# Patient Record
Sex: Female | Born: 1980 | Race: Asian | Hispanic: No | Marital: Married | Smoking: Never smoker
Health system: Southern US, Community
[De-identification: ages and names within clinical notes are randomized; demographics above are authoritative.]

## PROBLEM LIST (undated history)

## (undated) DIAGNOSIS — J45909 Unspecified asthma, uncomplicated: Secondary | ICD-10-CM

## (undated) DIAGNOSIS — N83209 Unspecified ovarian cyst, unspecified side: Secondary | ICD-10-CM

## (undated) DIAGNOSIS — D569 Thalassemia, unspecified: Secondary | ICD-10-CM

## (undated) DIAGNOSIS — N760 Acute vaginitis: Secondary | ICD-10-CM

## (undated) DIAGNOSIS — L309 Dermatitis, unspecified: Secondary | ICD-10-CM

## (undated) DIAGNOSIS — B9689 Other specified bacterial agents as the cause of diseases classified elsewhere: Secondary | ICD-10-CM

## (undated) HISTORY — DX: Unspecified asthma, uncomplicated: J45.909

## (undated) HISTORY — DX: Unspecified ovarian cyst, unspecified side: N83.209

## (undated) HISTORY — DX: Other specified bacterial agents as the cause of diseases classified elsewhere: N76.0

## (undated) HISTORY — DX: Dermatitis, unspecified: L30.9

## (undated) HISTORY — DX: Other specified bacterial agents as the cause of diseases classified elsewhere: B96.89

---

## 2016-05-08 ENCOUNTER — Encounter: Payer: Self-pay | Admitting: Family

## 2016-05-08 ENCOUNTER — Ambulatory Visit (INDEPENDENT_AMBULATORY_CARE_PROVIDER_SITE_OTHER): Payer: Self-pay | Admitting: Family

## 2016-05-08 VITALS — BP 104/68 | HR 78 | Temp 97.4°F | Wt 127.4 lb

## 2016-05-08 DIAGNOSIS — L298 Other pruritus: Secondary | ICD-10-CM

## 2016-05-08 DIAGNOSIS — N898 Other specified noninflammatory disorders of vagina: Secondary | ICD-10-CM

## 2016-05-08 DIAGNOSIS — Z0000 Encounter for general adult medical examination without abnormal findings: Secondary | ICD-10-CM | POA: Insufficient documentation

## 2016-05-08 MED ORDER — FLUCONAZOLE 150 MG PO TABS: 150.0000 mg | ORAL_TABLET | Freq: Once | ORAL | 1 refills | Status: AC

## 2016-05-08 NOTE — Assessment & Plan Note (Addendum)
Symptoms c/w vulvovaginal candidiasis. Trial of diflucan for yeast infection. Patient and I jointly decided not to do wet prep to more formally diagnose candidiasis due to cost at this time. We will treat empirically with close follow-up.

## 2016-05-08 NOTE — Progress Notes (Addendum)
Subjective:    Patient ID: Marissa Walsh, female    DOB: [birth year], 35 y.o.   MRN: [id number]  CC: Marissa KavaYimei Cade is a 35 y.o. female who presents today to establish care and for CPE.  HPI: Patient is a new patient here to establish care. Requesting medical records. Speaks mandarin. Interpretor in room.   She complains of external vaginal itching. Redness. Requesting a cream.   States pap smear ~4 years ago, normal.   No family h/o breast cancer.      HISTORY:  Past Medical History:  Diagnosis Date  . Asthma    History reviewed. No pertinent surgical history. Family History  Problem Relation Age of Onset  . Hyperlipidemia Mother   . Hypertension Mother   . Diabetes Mother   . Cancer Father     Allergies: Review of patient's allergies indicates not on file. No current outpatient prescriptions on file prior to visit.   No current facility-administered medications on file prior to visit.     Social History  Substance Use Topics  . Smoking status: Never Smoker  . Smokeless tobacco: Never Used  . Alcohol use No    Review of Systems  Constitutional: Negative for chills, fever and unexpected weight change.  HENT: Negative for congestion.   Respiratory: Negative for cough.   Cardiovascular: Negative for chest pain, palpitations and leg swelling.  Gastrointestinal: Negative for nausea and vomiting.  Genitourinary: Negative for dysuria, frequency, hematuria, vaginal discharge and vaginal pain.  Musculoskeletal: Negative for arthralgias and myalgias.  Skin: Negative for rash.  Neurological: Negative for headaches.  Hematological: Negative for adenopathy.  Psychiatric/Behavioral: Negative for confusion.      Objective:    BP 104/68 (BP Location: Left Arm, Patient Position: Sitting, Cuff Size: Normal)   Pulse 78   Temp 97.4 F (36.3 C) (Oral)   Wt 127 lb 6.4 oz (57.8 kg)   LMP  (LMP Unknown)   SpO2 98%  BP Readings from Last 3 Encounters:  05/08/16 104/68   Wt  Readings from Last 3 Encounters:  05/08/16 127 lb 6.4 oz (57.8 kg)    Physical Exam  Constitutional: She appears well-developed and well-nourished.  Eyes: Conjunctivae are normal.  Cardiovascular: Normal rate, regular rhythm, normal heart sounds and normal pulses.   Pulmonary/Chest: Effort normal and breath sounds normal. She has no wheezes. She has no rhonchi. She has no rales. Right breast exhibits no inverted nipple, no mass, no nipple discharge, no skin change and no tenderness. Left breast exhibits no inverted nipple, no mass, no nipple discharge, no skin change and no tenderness. Breasts are symmetrical.  Clinical breast exam performed, no masses, asymmetry, dippling noted in supine and bent forward position.  Abdominal: There is no CVA tenderness.  Genitourinary: There is no rash, tenderness or lesion on the right labia. There is no rash, tenderness or lesion on the left labia. No erythema, tenderness or bleeding in the vagina. No foreign body in the vagina. No vaginal discharge found.  Genitourinary Comments: Diffuse external vaginal erythema. No lesions. Thick white discharge seen on external labia. No purulent discharge.   Neurological: She is alert.  Skin: Skin is warm and dry.  Psychiatric: She has a normal mood and affect. Her speech is normal and behavior is normal. Thought content normal.  Vitals reviewed.      Assessment & Plan:   Problem List Items Addressed This Visit      Musculoskeletal and Integument   Vaginal itching - Primary  Symptoms c/w vulvovaginal candidiasis. Trial of diflucan for yeast infection. Patient and I jointly decided not to do wet prep to more formally diagnose candidiasis due to cost at this time. We will treat empirically with close follow-up.       Relevant Medications   fluconazole (DIFLUCAN) 150 MG tablet     Other   Routine general medical examination at a health care facility    Due for Pap smear and screening labs. Decided to do pap  smear and screening labs once cost is assessed. Immunization records requested.       Other Visit Diagnoses   None.      I am having Ms. Bodie start on fluconazole.   Meds ordered this encounter  Medications  . fluconazole (DIFLUCAN) 150 MG tablet    Sig: Take 1 tablet (150 mg total) by mouth once. Take one tablet PO once. If continue to have symptoms, may take one tablet PO 3 days later.    Dispense:  2 tablet    Refill:  1    Order Specific Question:   Supervising Provider    Answer:   Sherlene Shams [[id number]]    Return precautions given.   Risks, benefits, and alternatives of the medications and treatment plan prescribed today were discussed, and patient expressed understanding.   Education regarding symptom management and diagnosis given to patient on AVS.  Continue to follow with Rennie Plowman, FNP for routine health maintenance.   Marissa Kava and I agreed with plan.   Rennie Plowman, FNP

## 2016-05-08 NOTE — Patient Instructions (Addendum)
Pleasure meeting you.  As we discussed, but schedule follow-up appointment in 2 months to do Pap smear and labs.  Health Maintenance, Female Adopting a healthy lifestyle and getting preventive care can go a long way to promote health and wellness. Talk with your health care provider about what schedule of regular examinations is right for you. This is a good chance for you to check in with your provider about disease prevention and staying healthy. In between checkups, there are plenty of things you can do on your own. Experts have done a lot of research about which lifestyle changes and preventive measures are most likely to keep you healthy. Ask your health care provider for more information. WEIGHT AND DIET  Eat a healthy diet  Be sure to include plenty of vegetables, fruits, low-fat dairy products, and lean protein.  Do not eat a lot of foods high in solid fats, added sugars, or salt.  Get regular exercise. This is one of the most important things you can do for your health.  Most adults should exercise for at least 150 minutes each week. The exercise should increase your heart rate and make you sweat (moderate-intensity exercise).  Most adults should also do strengthening exercises at least twice a week. This is in addition to the moderate-intensity exercise.  Maintain a healthy weight  Body mass index (BMI) is a measurement that can be used to identify possible weight problems. It estimates body fat based on height and weight. Your health care provider can help determine your BMI and help you achieve or maintain a healthy weight.  For females 50 years of age and older:   A BMI below 18.5 is considered underweight.  A BMI of 18.5 to 24.9 is normal.  A BMI of 25 to 29.9 is considered overweight.  A BMI of 30 and above is considered obese.  Watch levels of cholesterol and blood lipids  You should start having your blood tested for lipids and cholesterol at 36 years of age,  then have this test every 5 years.  You may need to have your cholesterol levels checked more often if:  Your lipid or cholesterol levels are high.  You are older than 36 years of age.  You are at high risk for heart disease.  CANCER SCREENING   Lung Cancer  Lung cancer screening is recommended for adults 10-76 years old who are at high risk for lung cancer because of a history of smoking.  A yearly low-dose CT scan of the lungs is recommended for people who:  Currently smoke.  Have quit within the past 15 years.  Have at least a 30-pack-year history of smoking. A pack year is smoking an average of one pack of cigarettes a day for 1 year.  Yearly screening should continue until it has been 15 years since you quit.  Yearly screening should stop if you develop a health problem that would prevent you from having lung cancer treatment.  Breast Cancer  Practice breast self-awareness. This means understanding how your breasts normally appear and feel.  It also means doing regular breast self-exams. Let your health care provider know about any changes, no matter how small.  If you are in your 20s or 30s, you should have a clinical breast exam (CBE) by a health care provider every 1-3 years as part of a regular health exam.  If you are 64 or older, have a CBE every year. Also consider having a breast X-ray (mammogram) every year.  If you have a family history of breast cancer, talk to your health care provider about genetic screening.  If you are at high risk for breast cancer, talk to your health care provider about having an MRI and a mammogram every year.  Breast cancer gene (BRCA) assessment is recommended for women who have family members with BRCA-related cancers. BRCA-related cancers include:  Breast.  Ovarian.  Tubal.  Peritoneal cancers.  Results of the assessment will determine the need for genetic counseling and BRCA1 and BRCA2 testing. Cervical Cancer Your  health care provider may recommend that you be screened regularly for cancer of the pelvic organs (ovaries, uterus, and vagina). This screening involves a pelvic examination, including checking for microscopic changes to the surface of your cervix (Pap test). You may be encouraged to have this screening done every 3 years, beginning at age 86.  For women ages 31-65, health care providers may recommend pelvic exams and Pap testing every 3 years, or they may recommend the Pap and pelvic exam, combined with testing for human papilloma virus (HPV), every 5 years. Some types of HPV increase your risk of cervical cancer. Testing for HPV may also be done on women of any age with unclear Pap test results.  Other health care providers may not recommend any screening for nonpregnant women who are considered low risk for pelvic cancer and who do not have symptoms. Ask your health care provider if a screening pelvic exam is right for you.  If you have had past treatment for cervical cancer or a condition that could lead to cancer, you need Pap tests and screening for cancer for at least 20 years after your treatment. If Pap tests have been discontinued, your risk factors (such as having a new sexual partner) need to be reassessed to determine if screening should resume. Some women have medical problems that increase the chance of getting cervical cancer. In these cases, your health care provider may recommend more frequent screening and Pap tests. Colorectal Cancer  This type of cancer can be detected and often prevented.  Routine colorectal cancer screening usually begins at 36 years of age and continues through 36 years of age.  Your health care provider may recommend screening at an earlier age if you have risk factors for colon cancer.  Your health care provider may also recommend using home test kits to check for hidden blood in the stool.  A small camera at the end of a tube can be used to examine your  colon directly (sigmoidoscopy or colonoscopy). This is done to check for the earliest forms of colorectal cancer.  Routine screening usually begins at age 43.  Direct examination of the colon should be repeated every 5-10 years through 36 years of age. However, you may need to be screened more often if early forms of precancerous polyps or small growths are found. Skin Cancer  Check your skin from head to toe regularly.  Tell your health care provider about any new moles or changes in moles, especially if there is a change in a mole's shape or color.  Also tell your health care provider if you have a mole that is larger than the size of a pencil eraser.  Always use sunscreen. Apply sunscreen liberally and repeatedly throughout the day.  Protect yourself by wearing long sleeves, pants, a wide-brimmed hat, and sunglasses whenever you are outside. HEART DISEASE, DIABETES, AND HIGH BLOOD PRESSURE   High blood pressure causes heart disease and increases the risk  of stroke. High blood pressure is more likely to develop in:  People who have blood pressure in the high end of the normal range (130-139/85-89 mm Hg).  People who are overweight or obese.  People who are African American.  If you are 60-52 years of age, have your blood pressure checked every 3-5 years. If you are 64 years of age or older, have your blood pressure checked every year. You should have your blood pressure measured twice--once when you are at a hospital or clinic, and once when you are not at a hospital or clinic. Record the average of the two measurements. To check your blood pressure when you are not at a hospital or clinic, you can use:  An automated blood pressure machine at a pharmacy.  A home blood pressure monitor.  If you are between 69 years and 29 years old, ask your health care provider if you should take aspirin to prevent strokes.  Have regular diabetes screenings. This involves taking a blood sample to  check your fasting blood sugar level.  If you are at a normal weight and have a low risk for diabetes, have this test once every three years after 36 years of age.  If you are overweight and have a high risk for diabetes, consider being tested at a younger age or more often. PREVENTING INFECTION  Hepatitis B  If you have a higher risk for hepatitis B, you should be screened for this virus. You are considered at high risk for hepatitis B if:  You were born in a country where hepatitis B is common. Ask your health care provider which countries are considered high risk.  Your parents were born in a high-risk country, and you have not been immunized against hepatitis B (hepatitis B vaccine).  You have HIV or AIDS.  You use needles to inject street drugs.  You live with someone who has hepatitis B.  You have had sex with someone who has hepatitis B.  You get hemodialysis treatment.  You take certain medicines for conditions, including cancer, organ transplantation, and autoimmune conditions. Hepatitis C  Blood testing is recommended for:  Everyone born from [birth year] through 1966.  Anyone with known risk factors for hepatitis C. Sexually transmitted infections (STIs)  You should be screened for sexually transmitted infections (STIs) including gonorrhea and chlamydia if:  You are sexually active and are younger than 36 years of age.  You are older than 36 years of age and your health care provider tells you that you are at risk for this type of infection.  Your sexual activity has changed since you were last screened and you are at an increased risk for chlamydia or gonorrhea. Ask your health care provider if you are at risk.  If you do not have HIV, but are at risk, it may be recommended that you take a prescription medicine daily to prevent HIV infection. This is called pre-exposure prophylaxis (PrEP). You are considered at risk if:  You are sexually active and do not regularly use  condoms or know the HIV status of your partner(s).  You take drugs by injection.  You are sexually active with a partner who has HIV. Talk with your health care provider about whether you are at high risk of being infected with HIV. If you choose to begin PrEP, you should first be tested for HIV. You should then be tested every 3 months for as long as you are taking PrEP.  PREGNANCY  If you are premenopausal and you may become pregnant, ask your health care provider about preconception counseling.  If you may become pregnant, take 400 to 800 micrograms (mcg) of folic acid every day.  If you want to prevent pregnancy, talk to your health care provider about birth control (contraception). OSTEOPOROSIS AND MENOPAUSE   Osteoporosis is a disease in which the bones lose minerals and strength with aging. This can result in serious bone fractures. Your risk for osteoporosis can be identified using a bone density scan.  If you are 14 years of age or older, or if you are at risk for osteoporosis and fractures, ask your health care provider if you should be screened.  Ask your health care provider whether you should take a calcium or vitamin D supplement to lower your risk for osteoporosis.  Menopause may have certain physical symptoms and risks.  Hormone replacement therapy may reduce some of these symptoms and risks. Talk to your health care provider about whether hormone replacement therapy is right for you.  HOME CARE INSTRUCTIONS   Schedule regular health, dental, and eye exams.  Stay current with your immunizations.   Do not use any tobacco products including cigarettes, chewing tobacco, or electronic cigarettes.  If you are pregnant, do not drink alcohol.  If you are breastfeeding, limit how much and how often you drink alcohol.  Limit alcohol intake to no more than 1 drink per day for nonpregnant women. One drink equals 12 ounces of beer, 5 ounces of wine, or 1 ounces of hard  liquor.  Do not use street drugs.  Do not share needles.  Ask your health care provider for help if you need support or information about quitting drugs.  Tell your health care provider if you often feel depressed.  Tell your health care provider if you have ever been abused or do not feel safe at home.   This information is not intended to replace advice given to you by your health care provider. Make sure you discuss any questions you have with your health care provider.   Document Released: 04/02/2011 Document Revised: 10/08/2014 Document Reviewed: 08/19/2013 Elsevier Interactive Patient Education 2016 [employer]   Monilial Vaginitis Vaginitis in a soreness, swelling and redness (inflammation) of the vagina and vulva. Monilial vaginitis is not a sexually transmitted infection. CAUSES  Yeast vaginitis is caused by yeast (candida) that is normally found in your vagina. With a yeast infection, the candida has overgrown in number to a point that upsets the chemical balance. SYMPTOMS   White, thick vaginal discharge.  Swelling, itching, redness and irritation of the vagina and possibly the lips of the vagina (vulva).  Burning or painful urination.  Painful intercourse. DIAGNOSIS  Things that may contribute to monilial vaginitis are:  Postmenopausal and virginal states.  Pregnancy.  Infections.  Being tired, sick or stressed, especially if you had monilial vaginitis in the past.  Diabetes. Good control will help lower the chance.  Birth control pills.  Tight fitting garments.  Using bubble bath, feminine sprays, douches or deodorant tampons.  Taking certain medications that kill germs (antibiotics).  Sporadic recurrence can occur if you become ill. TREATMENT  Your caregiver will give you medication.  There are several kinds of anti monilial vaginal creams and suppositories specific for monilial vaginitis. For recurrent yeast infections, use a suppository or  cream in the vagina 2 times a week, or as directed.  Anti-monilial or steroid cream for the itching or irritation of the vulva  may also be used. Get your caregiver's permission.  Painting the vagina with methylene blue solution may help if the monilial cream does not work.  Eating yogurt may help prevent monilial vaginitis. HOME CARE INSTRUCTIONS   Finish all medication as prescribed.  Do not have sex until treatment is completed or after your caregiver tells you it is okay.  Take warm sitz baths.  Do not douche.  Do not use tampons, especially scented ones.  Wear cotton underwear.  Avoid tight pants and panty hose.  Tell your sexual partner that you have a yeast infection. They should go to their caregiver if they have symptoms such as mild rash or itching.  Your sexual partner should be treated as well if your infection is difficult to eliminate.  Practice safer sex. Use condoms.  Some vaginal medications cause latex condoms to fail. Vaginal medications that harm condoms are:  Cleocin cream.  Butoconazole (Femstat).  Terconazole (Terazol) vaginal suppository.  Miconazole (Monistat) (may be purchased over the counter). SEEK MEDICAL CARE IF:   You have a temperature by mouth above 102 F (38.9 C).  The infection is getting worse after 2 days of treatment.  The infection is not getting better after 3 days of treatment.  You develop blisters in or around your vagina.  You develop vaginal bleeding, and it is not your menstrual period.  You have pain when you urinate.  You develop intestinal problems.  You have pain with sexual intercourse.   This information is not intended to replace advice given to you by your health care provider. Make sure you discuss any questions you have with your health care provider.   Document Released: 06/27/2005 Document Revised: 12/10/2011 Document Reviewed: 03/21/2015 Elsevier Interactive Patient Education [employer].

## 2016-05-08 NOTE — Assessment & Plan Note (Addendum)
Due for Pap smear and screening labs. Patient politely declined. Decided to do pap smear and screening labs once cost is assessed. Immunization records requested.

## 2016-05-11 NOTE — Progress Notes (Signed)
Spoke to patient and she stated ok and has already made appointment in October.

## 2016-05-11 NOTE — Progress Notes (Signed)
Will you try calling ( or mailing to this patient) and let her know -   Mrs. Marissa Walsh,   The self pay the cost for routine labs and pelvic exam with pap smear is approximately $180.   Would you be willing to come back in to make this appointment? If so, please give us a call to make an appointment.   Pleasure meeting you and look forward to seeing you soon!   Rory PercyBest,  [facility], NP

## 2016-07-11 ENCOUNTER — Encounter: Payer: Self-pay | Admitting: Family

## 2016-07-11 ENCOUNTER — Other Ambulatory Visit (HOSPITAL_COMMUNITY)
Admission: RE | Admit: 2016-07-11 | Discharge: 2016-07-11 | Disposition: A | Discharge status: Home / Self Care | Payer: Self-pay | Source: Ambulatory Visit | Attending: Family | Admitting: Family

## 2016-07-11 ENCOUNTER — Ambulatory Visit (INDEPENDENT_AMBULATORY_CARE_PROVIDER_SITE_OTHER): Payer: Self-pay | Admitting: Family

## 2016-07-11 VITALS — BP 102/76 | HR 64 | Temp 97.4°F | Resp 10 | Ht 62.25 in | Wt 127.0 lb

## 2016-07-11 DIAGNOSIS — Z0000 Encounter for general adult medical examination without abnormal findings: Secondary | ICD-10-CM

## 2016-07-11 DIAGNOSIS — Z01419 Encounter for gynecological examination (general) (routine) without abnormal findings: Secondary | ICD-10-CM | POA: Insufficient documentation

## 2016-07-11 DIAGNOSIS — N760 Acute vaginitis: Secondary | ICD-10-CM | POA: Insufficient documentation

## 2016-07-11 DIAGNOSIS — Z1151 Encounter for screening for human papillomavirus (HPV): Secondary | ICD-10-CM | POA: Insufficient documentation

## 2016-07-11 DIAGNOSIS — Z113 Encounter for screening for infections with a predominantly sexual mode of transmission: Secondary | ICD-10-CM | POA: Insufficient documentation

## 2016-07-11 LAB — COMPREHENSIVE METABOLIC PANEL
ALT: 14 U/L (ref 0–35)
AST: 15 U/L (ref 0–37)
Albumin: 3.9 g/dL (ref 3.5–5.2)
Alkaline Phosphatase: 42 U/L (ref 39–117)
BILIRUBIN TOTAL: 0.8 mg/dL (ref 0.2–1.2)
BUN: 13 mg/dL (ref 6–23)
CALCIUM: 9.6 mg/dL (ref 8.4–10.5)
CO2: 29 meq/L (ref 19–32)
CREATININE: 0.48 mg/dL (ref 0.40–1.20)
Chloride: 103 mEq/L (ref 96–112)
GFR: 155.35 mL/min (ref >60.00)
GLUCOSE: 82 mg/dL (ref 70–99)
Potassium: 4.1 mEq/L (ref 3.5–5.1)
Sodium: 141 mEq/L (ref 135–145)
TOTAL PROTEIN: 7.1 g/dL (ref 6.0–8.3)

## 2016-07-11 LAB — LIPID PANEL
CHOL/HDL RATIO: 4
Cholesterol: 188 mg/dL (ref 0–200)
HDL: 43.7 mg/dL (ref >39.00)
LDL CALC: 125 mg/dL — AB (ref 0–99)
NONHDL: 143.84
Triglycerides: 92 mg/dL (ref 0.0–149.0)
VLDL: 18.4 mg/dL (ref 0.0–40.0)

## 2016-07-11 LAB — CBC WITH DIFFERENTIAL/PLATELET
BASOS ABS: 0 10*3/uL (ref 0.0–0.1)
Basophils Relative: 0.5 % (ref 0.0–3.0)
EOS ABS: 0.2 10*3/uL (ref 0.0–0.7)
Eosinophils Relative: 3 % (ref 0.0–5.0)
HEMATOCRIT: 35.2 % — AB (ref 36.0–46.0)
Hemoglobin: 11.2 g/dL — ABNORMAL LOW (ref 12.0–15.0)
LYMPHS PCT: 20.5 % (ref 12.0–46.0)
Lymphs Abs: 1.3 10*3/uL (ref 0.7–4.0)
MCHC: 31.9 g/dL (ref 30.0–36.0)
MCV: 68.1 fl — ABNORMAL LOW (ref 78.0–100.0)
Monocytes Absolute: 0.4 10*3/uL (ref 0.1–1.0)
Monocytes Relative: 6 % (ref 3.0–12.0)
NEUTROS ABS: 4.4 10*3/uL (ref 1.4–7.7)
NEUTROS PCT: 70 % (ref 43.0–77.0)
PLATELETS: 202 10*3/uL (ref 150.0–400.0)
RBC: 5.17 Mil/uL — AB (ref 3.87–5.11)
RDW: 15.1 % (ref 11.5–15.5)
WBC: 6.3 10*3/uL (ref 4.0–10.5)

## 2016-07-11 LAB — VITAMIN D 25 HYDROXY (VIT D DEFICIENCY, FRACTURES): VITD: 24.21 ng/mL — AB (ref 30.00–100.00)

## 2016-07-11 LAB — TSH: TSH: 1.73 u[IU]/mL (ref 0.35–4.50)

## 2016-07-11 LAB — HEMOGLOBIN A1C: Hgb A1c MFr Bld: 5.1 % (ref 4.6–6.5)

## 2016-07-11 MED ORDER — TETANUS-DIPHTH-ACELL PERTUSSIS 5-2.5-18.5 LF-MCG/0.5 IM SUSP: 0.5000 mL | Freq: Once | INTRAMUSCULAR | 0 refills | Status: AC

## 2016-07-11 NOTE — Progress Notes (Signed)
Pre-visit discussion using our clinic review tool. No additional management support is needed unless otherwise documented below in the visit note.  

## 2016-07-11 NOTE — Progress Notes (Signed)
Subjective:    Patient ID: Marissa Walsh, female    DOB: 05-05-1981, 35 y.o.   MRN: [id number]  CC: Marissa Walsh is a 35 y.o. female who presents today for physical exam.    HPI: Patient here for CPE and pap smear.   No concern for pregnancy. Would like STD testing however no new partners.  Continues to have vaginal itching which did not improve with Diflucan. No foul odor or increase in discharge. No vaginal pain.     Father died young from colon cancer; patient would need colonoscopy in next couple of years.  Breast Cancer Screening: No early family history; no breast complaints.  Cervical Cancer Screening: Will do today.  Immunizations- no history; due for Tdap requested from patient.         HIV Screening- Candidate for ; will get today. Labs: Screening labs today. Exercise: None Alcohol use: None Smoking/tobacco use: Nonsmoker.    HISTORY:  Past Medical History:  Diagnosis Date  . Asthma     History reviewed. No pertinent surgical history. Family History  Problem Relation Age of Onset  . Hyperlipidemia Mother   . Hypertension Mother   . Diabetes Mother   . Cancer Father 70    colon cancer      ALLERGIES: Alcohol-sulfur [sulfur]  No current outpatient prescriptions on file prior to visit.   No current facility-administered medications on file prior to visit.     Social History  Substance Use Topics  . Smoking status: Never Smoker  . Smokeless tobacco: Never Used  . Alcohol use No    Review of Systems  Constitutional: Negative for chills, fever and unexpected weight change.  HENT: Negative for congestion.   Respiratory: Negative for cough.   Cardiovascular: Negative for chest pain, palpitations and leg swelling.  Gastrointestinal: Negative for nausea and vomiting.  Genitourinary: Positive for vaginal discharge. Negative for dysuria, flank pain, menstrual problem, pelvic pain, vaginal bleeding and vaginal pain.  Musculoskeletal: Negative for arthralgias and  myalgias.  Skin: Negative for rash.  Neurological: Negative for headaches.  Hematological: Negative for adenopathy.  Psychiatric/Behavioral: Negative for confusion.      Objective:    BP 102/76   Pulse 64   Temp 97.4 F (36.3 C) (Oral)   Resp 10   Ht 5' 2.25" (1.581 m)   Wt 127 lb (57.6 kg)   LMP 06/20/2016 (Approximate)   SpO2 99%   BMI 23.04 kg/m   BP Readings from Last 3 Encounters:  07/11/16 102/76  05/08/16 104/68   Wt Readings from Last 3 Encounters:  07/11/16 127 lb (57.6 kg)  05/08/16 127 lb 6.4 oz (57.8 kg)    Physical Exam  Constitutional: She appears well-developed and well-nourished.  Eyes: Conjunctivae are normal.  Neck: No thyroid mass and no thyromegaly present.  Cardiovascular: Normal rate, regular rhythm, normal heart sounds and normal pulses.   Pulmonary/Chest: Effort normal and breath sounds normal. She has no wheezes. She has no rhonchi. She has no rales. Right breast exhibits no inverted nipple, no mass, no nipple discharge, no skin change and no tenderness. Left breast exhibits no inverted nipple, no mass, no nipple discharge, no skin change and no tenderness. Breasts are symmetrical.  No masses or asymmetry appreciated during CBE.  Abdominal: There is no CVA tenderness.  Genitourinary: There is no rash, tenderness or lesion on the right labia. There is no rash, tenderness or lesion on the left labia. Uterus is not enlarged, not fixed and not tender. Cervix  exhibits no motion tenderness, no discharge and no friability. Right adnexum displays no mass, no tenderness and no fullness. Left adnexum displays no mass, no tenderness and no fullness. There is erythema in the vagina. No tenderness or bleeding in the vagina. No foreign body in the vagina. No signs of injury around the vagina. No vaginal discharge found.  Genitourinary Comments: Pap performed. No CMT. Unable to appreciated ovaries. Diffuse erythema vaginal canal. Moderate thick, white discharge from  vagina and cervix. No odor.   Lymphadenopathy:       Head (right side): No submental, no submandibular, no tonsillar, no preauricular, no posterior auricular and no occipital adenopathy present.       Head (left side): No submental, no submandibular, no tonsillar, no preauricular, no posterior auricular and no occipital adenopathy present.       Right cervical: No superficial cervical, no deep cervical and no posterior cervical adenopathy present.      Left cervical: No superficial cervical, no deep cervical and no posterior cervical adenopathy present.    She has no axillary adenopathy.       Right axillary: No pectoral and no lateral adenopathy present.       Left axillary: No pectoral and no lateral adenopathy present. Neurological: She is alert.  Skin: Skin is warm and dry.  Psychiatric: She has a normal mood and affect. Her speech is normal and behavior is normal. Thought content normal.  Vitals reviewed.      Assessment & Plan:   Problem List Items Addressed This Visit      Other   Routine general medical examination at a health care facility    Patient has no early family history of breast cancer and we jointly decided to defer screening at this time until patient is 35 years old. Patient's father died quite young from colon cancer, and patient and I had a long discussion about her risk as well of colon cancer. I would be most comfortable if patient was able to have a colonoscopy in the next 1-2 years. Patient has some concern as she is self-pay, and I advised her to fill out [facility] financial aid forms so she will not have to pay out-of-pocket for colonoscopy as well as ongoing health prevention, maintenance. Patient continues to complain of vaginal itching we jointly decided to wait on results of wet prep, Pap smear prior to treatment. Screening labs included today, including HIV and STD as patient requested. Advised patient to go to pharmacy to check price of tetanus vaccine, as again,  she is self-pay. Encouraged healthy diet and exercise.       Other Visit Diagnoses    Routine physical examination    -  Primary   Relevant Medications   Tdap (BOOSTRIX) 5-2.5-18.5 LF-MCG/0.5 injection   Other Relevant Orders   CBC with Differential/Platelet   Comprehensive metabolic panel   Hemoglobin A1c   HIV antibody   Lipid panel   Cytology - PAP   TSH   VITAMIN D 25 Hydroxy (Vit-D Deficiency, Fractures)   Cervicovaginal ancillary only       I am having Ms. Brogden start on Tdap.   Meds ordered this encounter  Medications  . Tdap (BOOSTRIX) 5-2.5-18.5 LF-MCG/0.5 injection    Sig: Inject 0.5 mLs into the muscle once.    Dispense:  0.5 mL    Refill:  0    Order Specific Question:   Supervising Provider    Answer:   Duncan Dull L [[id number]]  Return precautions given.   Risks, benefits, and alternatives of the medications and treatment plan prescribed today were discussed, and patient expressed understanding.   Education regarding symptom management and diagnosis given to patient on AVS.   Continue to follow with Rennie PlowmanMargaret Jacey Pelc, FNP for routine health maintenance.   Marissa KavaYimei Glore and I agreed with plan.   Rennie PlowmanMargaret Damaris Geers, FNP

## 2016-07-11 NOTE — Addendum Note (Signed)
Addended by: Felix AhmadiFRANSEN, Janese Radabaugh A on: 07/11/2016 10:08 AM   Modules accepted: Orders

## 2016-07-11 NOTE — Patient Instructions (Signed)
Please check cost of tetanus vaccine.   I would like to have a colonoscopy scheduled for you in 1-2 years based on your father's history.   Health Maintenance, Female Adopting a healthy lifestyle and getting preventive care can go a long way to promote health and wellness. Talk with your health care provider about what schedule of regular examinations is right for you. This is a good chance for you to check in with your provider about disease prevention and staying healthy. In between checkups, there are plenty of things you can do on your own. Experts have done a lot of research about which lifestyle changes and preventive measures are most likely to keep you healthy. Ask your health care provider for more information. WEIGHT AND DIET  Eat a healthy diet  Be sure to include plenty of vegetables, fruits, low-fat dairy products, and lean protein.  Do not eat a lot of foods high in solid fats, added sugars, or salt.  Get regular exercise. This is one of the most important things you can do for your health.  Most adults should exercise for at least 150 minutes each week. The exercise should increase your heart rate and make you sweat (moderate-intensity exercise).  Most adults should also do strengthening exercises at least twice a week. This is in addition to the moderate-intensity exercise.  Maintain a healthy weight  Body mass index (BMI) is a measurement that can be used to identify possible weight problems. It estimates body fat based on height and weight. Your health care provider can help determine your BMI and help you achieve or maintain a healthy weight.  For females 20 years of age and older:   A BMI below 18.5 is considered underweight.  A BMI of 18.5 to 24.9 is normal.  A BMI of 25 to 29.9 is considered overweight.  A BMI of 30 and above is considered obese.  Watch levels of cholesterol and blood lipids  You should start having your blood tested for lipids and  cholesterol at 36 years of age, then have this test every 5 years.  You may need to have your cholesterol levels checked more often if:  Your lipid or cholesterol levels are high.  You are older than 36 years of age.  You are at high risk for heart disease.  CANCER SCREENING   Lung Cancer  Lung cancer screening is recommended for adults 55-80 years old who are at high risk for lung cancer because of a history of smoking.  A yearly low-dose CT scan of the lungs is recommended for people who:  Currently smoke.  Have quit within the past 15 years.  Have at least a 30-pack-year history of smoking. A pack year is smoking an average of one pack of cigarettes a day for 1 year.  Yearly screening should continue until it has been 15 years since you quit.  Yearly screening should stop if you develop a health problem that would prevent you from having lung cancer treatment.  Breast Cancer  Practice breast self-awareness. This means understanding how your breasts normally appear and feel.  It also means doing regular breast self-exams. Let your health care provider know about any changes, no matter how small.  If you are in your 20s or 30s, you should have a clinical breast exam (CBE) by a health care provider every 1-3 years as part of a regular health exam.  If you are 40 or older, have a CBE every year. Also consider having   a breast X-ray (mammogram) every year.  If you have a family history of breast cancer, talk to your health care provider about genetic screening.  If you are at high risk for breast cancer, talk to your health care provider about having an MRI and a mammogram every year.  Breast cancer gene (BRCA) assessment is recommended for women who have family members with BRCA-related cancers. BRCA-related cancers include:  Breast.  Ovarian.  Tubal.  Peritoneal cancers.  Results of the assessment will determine the need for genetic counseling and BRCA1 and BRCA2  testing. Cervical Cancer Your health care provider may recommend that you be screened regularly for cancer of the pelvic organs (ovaries, uterus, and vagina). This screening involves a pelvic examination, including checking for microscopic changes to the surface of your cervix (Pap test). You may be encouraged to have this screening done every 3 years, beginning at age 21.  For women ages 30-65, health care providers may recommend pelvic exams and Pap testing every 3 years, or they may recommend the Pap and pelvic exam, combined with testing for human papilloma virus (HPV), every 5 years. Some types of HPV increase your risk of cervical cancer. Testing for HPV may also be done on women of any age with unclear Pap test results.  Other health care providers may not recommend any screening for nonpregnant women who are considered low risk for pelvic cancer and who do not have symptoms. Ask your health care provider if a screening pelvic exam is right for you.  If you have had past treatment for cervical cancer or a condition that could lead to cancer, you need Pap tests and screening for cancer for at least 20 years after your treatment. If Pap tests have been discontinued, your risk factors (such as having a new sexual partner) need to be reassessed to determine if screening should resume. Some women have medical problems that increase the chance of getting cervical cancer. In these cases, your health care provider may recommend more frequent screening and Pap tests. Colorectal Cancer  This type of cancer can be detected and often prevented.  Routine colorectal cancer screening usually begins at 36 years of age and continues through 36 years of age.  Your health care provider may recommend screening at an earlier age if you have risk factors for colon cancer.  Your health care provider may also recommend using home test kits to check for hidden blood in the stool.  A small camera at the end of a  tube can be used to examine your colon directly (sigmoidoscopy or colonoscopy). This is done to check for the earliest forms of colorectal cancer.  Routine screening usually begins at age 50.  Direct examination of the colon should be repeated every 5-10 years through 36 years of age. However, you may need to be screened more often if early forms of precancerous polyps or small growths are found. Skin Cancer  Check your skin from head to toe regularly.  Tell your health care provider about any new moles or changes in moles, especially if there is a change in a mole's shape or color.  Also tell your health care provider if you have a mole that is larger than the size of a pencil eraser.  Always use sunscreen. Apply sunscreen liberally and repeatedly throughout the day.  Protect yourself by wearing long sleeves, pants, a wide-brimmed hat, and sunglasses whenever you are outside. HEART DISEASE, DIABETES, AND HIGH BLOOD PRESSURE   High blood pressure   causes heart disease and increases the risk of stroke. High blood pressure is more likely to develop in:  People who have blood pressure in the high end of the normal range (130-139/85-89 mm Hg).  People who are overweight or obese.  People who are African American.  If you are 23-48 years of age, have your blood pressure checked every 3-5 years. If you are 99 years of age or older, have your blood pressure checked every year. You should have your blood pressure measured twice--once when you are at a hospital or clinic, and once when you are not at a hospital or clinic. Record the average of the two measurements. To check your blood pressure when you are not at a hospital or clinic, you can use:  An automated blood pressure machine at a pharmacy.  A home blood pressure monitor.  If you are between 41 years and 32 years old, ask your health care provider if you should take aspirin to prevent strokes.  Have regular diabetes screenings. This  involves taking a blood sample to check your fasting blood sugar level.  If you are at a normal weight and have a low risk for diabetes, have this test once every three years after 36 years of age.  If you are overweight and have a high risk for diabetes, consider being tested at a younger age or more often. PREVENTING INFECTION  Hepatitis B  If you have a higher risk for hepatitis B, you should be screened for this virus. You are considered at high risk for hepatitis B if:  You were born in a country where hepatitis B is common. Ask your health care provider which countries are considered high risk.  Your parents were born in a high-risk country, and you have not been immunized against hepatitis B (hepatitis B vaccine).  You have HIV or AIDS.  You use needles to inject street drugs.  You live with someone who has hepatitis B.  You have had sex with someone who has hepatitis B.  You get hemodialysis treatment.  You take certain medicines for conditions, including cancer, organ transplantation, and autoimmune conditions. Hepatitis C  Blood testing is recommended for:  Everyone born from [birth year] through 1966.  Anyone with known risk factors for hepatitis C. Sexually transmitted infections (STIs)  You should be screened for sexually transmitted infections (STIs) including gonorrhea and chlamydia if:  You are sexually active and are younger than 36 years of age.  You are older than 36 years of age and your health care provider tells you that you are at risk for this type of infection.  Your sexual activity has changed since you were last screened and you are at an increased risk for chlamydia or gonorrhea. Ask your health care provider if you are at risk.  If you do not have HIV, but are at risk, it may be recommended that you take a prescription medicine daily to prevent HIV infection. This is called pre-exposure prophylaxis (PrEP). You are considered at risk if:  You are  sexually active and do not regularly use condoms or know the HIV status of your partner(s).  You take drugs by injection.  You are sexually active with a partner who has HIV. Talk with your health care provider about whether you are at high risk of being infected with HIV. If you choose to begin PrEP, you should first be tested for HIV. You should then be tested every 3 months for as long as you  are taking PrEP.  PREGNANCY   If you are premenopausal and you may become pregnant, ask your health care provider about preconception counseling.  If you may become pregnant, take 400 to 800 micrograms (mcg) of folic acid every day.  If you want to prevent pregnancy, talk to your health care provider about birth control (contraception). OSTEOPOROSIS AND MENOPAUSE   Osteoporosis is a disease in which the bones lose minerals and strength with aging. This can result in serious bone fractures. Your risk for osteoporosis can be identified using a bone density scan.  If you are 71 years of age or older, or if you are at risk for osteoporosis and fractures, ask your health care provider if you should be screened.  Ask your health care provider whether you should take a calcium or vitamin D supplement to lower your risk for osteoporosis.  Menopause may have certain physical symptoms and risks.  Hormone replacement therapy may reduce some of these symptoms and risks. Talk to your health care provider about whether hormone replacement therapy is right for you.  HOME CARE INSTRUCTIONS   Schedule regular health, dental, and eye exams.  Stay current with your immunizations.   Do not use any tobacco products including cigarettes, chewing tobacco, or electronic cigarettes.  If you are pregnant, do not drink alcohol.  If you are breastfeeding, limit how much and how often you drink alcohol.  Limit alcohol intake to no more than 1 drink per day for nonpregnant women. One drink equals 12 ounces of beer, 5  ounces of wine, or 1 ounces of hard liquor.  Do not use street drugs.  Do not share needles.  Ask your health care provider for help if you need support or information about quitting drugs.  Tell your health care provider if you often feel depressed.  Tell your health care provider if you have ever been abused or do not feel safe at home.   This information is not intended to replace advice given to you by your health care provider. Make sure you discuss any questions you have with your health care provider.   Document Released: 04/02/2011 Document Revised: 10/08/2014 Document Reviewed: 08/19/2013 Elsevier Interactive Patient Education [employer].

## 2016-07-11 NOTE — Assessment & Plan Note (Signed)
Patient has no early family history of breast cancer and we jointly decided to defer screening at this time until patient is 35 years old. Patient's father died quite young from colon cancer, and patient and I had a long discussion about her risk as well of colon cancer. I would be most comfortable if patient was able to have a colonoscopy in the next 1-2 years. Patient has some concern as she is self-pay, and I advised her to fill out [facility] financial aid forms so she will not have to pay out-of-pocket for colonoscopy as well as ongoing health prevention, maintenance. Patient continues to complain of vaginal itching we jointly decided to wait on results of wet prep, Pap smear prior to treatment. Screening labs included today, including HIV and STD as patient requested. Advised patient to go to pharmacy to check price of tetanus vaccine, as again, she is self-pay. Encouraged healthy diet and exercise.

## 2016-07-12 ENCOUNTER — Encounter: Payer: Self-pay | Admitting: Family

## 2016-07-12 ENCOUNTER — Other Ambulatory Visit: Payer: Self-pay | Admitting: Family

## 2016-07-12 DIAGNOSIS — B379 Candidiasis, unspecified: Secondary | ICD-10-CM

## 2016-07-12 DIAGNOSIS — B9689 Other specified bacterial agents as the cause of diseases classified elsewhere: Secondary | ICD-10-CM

## 2016-07-12 DIAGNOSIS — D649 Anemia, unspecified: Secondary | ICD-10-CM | POA: Insufficient documentation

## 2016-07-12 DIAGNOSIS — N760 Acute vaginitis: Secondary | ICD-10-CM

## 2016-07-12 LAB — CYTOLOGY - PAP

## 2016-07-12 LAB — CERVICOVAGINAL ANCILLARY ONLY: WET PREP (BD AFFIRM): POSITIVE — AB

## 2016-07-12 LAB — HIV ANTIBODY (ROUTINE TESTING W REFLEX): HIV: NONREACTIVE

## 2016-07-12 MED ORDER — METRONIDAZOLE 500 MG PO TABS: 500.0000 mg | ORAL_TABLET | Freq: Two times a day (BID) | ORAL | 0 refills | Status: DC

## 2016-07-12 MED ORDER — FLUCONAZOLE 150 MG PO TABS: 150.0000 mg | ORAL_TABLET | Freq: Once | ORAL | 1 refills | Status: AC

## 2016-07-12 NOTE — Progress Notes (Signed)
Please call and mail results to patient.   See result note

## 2016-07-13 LAB — CERVICOVAGINAL ANCILLARY ONLY: Herpes: NEGATIVE

## 2016-10-25 ENCOUNTER — Ambulatory Visit (INDEPENDENT_AMBULATORY_CARE_PROVIDER_SITE_OTHER): Payer: Self-pay | Admitting: Family

## 2016-10-25 ENCOUNTER — Other Ambulatory Visit (HOSPITAL_COMMUNITY)
Admission: RE | Admit: 2016-10-25 | Discharge: 2016-10-25 | Disposition: A | Discharge status: Home / Self Care | Payer: Self-pay | Source: Ambulatory Visit | Attending: Family | Admitting: Family

## 2016-10-25 ENCOUNTER — Encounter: Payer: Self-pay | Admitting: Family

## 2016-10-25 VITALS — BP 100/68 | HR 83 | Temp 98.1°F | Ht 62.25 in | Wt 131.2 lb

## 2016-10-25 DIAGNOSIS — F320 Major depressive disorder, single episode, mild: Secondary | ICD-10-CM

## 2016-10-25 DIAGNOSIS — N898 Other specified noninflammatory disorders of vagina: Secondary | ICD-10-CM

## 2016-10-25 DIAGNOSIS — L299 Pruritus, unspecified: Secondary | ICD-10-CM | POA: Insufficient documentation

## 2016-10-25 DIAGNOSIS — L298 Other pruritus: Secondary | ICD-10-CM

## 2016-10-25 MED ORDER — FLUCONAZOLE 150 MG PO TABS: 150.0000 mg | ORAL_TABLET | ORAL | 0 refills | Status: DC

## 2016-10-25 MED ORDER — CLOTRIMAZOLE 2 % VA CREA: 1.0000 | TOPICAL_CREAM | Freq: Every day | VAGINAL | 0 refills | Status: AC

## 2016-10-25 MED ORDER — SERTRALINE HCL 50 MG PO TABS: 50.0000 mg | ORAL_TABLET | Freq: Every day | ORAL | 3 refills | Status: DC

## 2016-10-25 NOTE — Assessment & Plan Note (Signed)
New. Recent separation from husband. Trial of Zoloft. Follow-up in 6 weeks.

## 2016-10-25 NOTE — Addendum Note (Signed)
Addended by: Allegra GranaARNETT, Avah Bashor G on: 10/25/2016 09:00 AM   Modules accepted: Orders

## 2016-10-25 NOTE — Assessment & Plan Note (Signed)
Symptoms on exam again very consistent with vaginal candidiasis. Thick, curd-like white discharge. Pending wet prep. Considering maintenance therapy the patient does not respond to 3 doses of Diflucan 3 days apart.

## 2016-10-25 NOTE — Progress Notes (Signed)
Subjective:    Patient ID: Marissa Walsh, female    DOB: 1981/09/09, 36 y.o.   MRN: [id number]  CC: Marissa Walsh is a 36 y.o. female who presents today for an acute visit.    HPI: Patient here today with chief complaint of vaginal itching. She describes the itching as 'outside of the vagina' and not deep inside. She's had no changes in her vaginal discharge. It is thin and white. No green discharge. No new sexual partners or concern for STDs. No abdominal pain, fever or chills. Also complains of itching on her skin on her lower abdomen. She wonders if it's "dry skin". No concerns for pregnancy  Patient was seen 4 months ago approximately for the same issue and given Diflucan. With no improvement.  Patient is recently separated from her husband and reports sadness. No thoughts of hurting herself or anyone else. No anxiety. Occasional trouble falling asleep.      HISTORY:  Past Medical History:  Diagnosis Date  . Asthma    No past surgical history on file. Family History  Problem Relation Age of Onset  . Hyperlipidemia Mother   . Hypertension Mother   . Diabetes Mother   . Cancer Father 98    colon cancer    Allergies: Alcohol-sulfur [sulfur] Current Outpatient Prescriptions on File Prior to Visit  Medication Sig Dispense Refill  . metroNIDAZOLE (FLAGYL) 500 MG tablet Take 1 tablet (500 mg total) by mouth 2 (two) times daily. 14 tablet 0   No current facility-administered medications on file prior to visit.     Social History  Substance Use Topics  . Smoking status: Never Smoker  . Smokeless tobacco: Never Used  . Alcohol use No    Review of Systems  Constitutional: Negative for chills and fever.  Respiratory: Negative for cough.   Cardiovascular: Negative for chest pain and palpitations.  Gastrointestinal: Negative for nausea and vomiting.  Genitourinary: Negative for vaginal bleeding, vaginal discharge and vaginal pain.  Skin: Positive for rash.  Psychiatric/Behavioral:  Positive for sleep disturbance. Negative for self-injury and suicidal ideas. The patient is not nervous/anxious and is not hyperactive.       Objective:    BP 100/68   Pulse 83   Temp 98.1 F (36.7 C) (Oral)   Ht 5' 2.25" (1.581 m)   Wt 131 lb 3.2 oz (59.5 kg)   LMP  (LMP Unknown)   SpO2 99%   BMI 23.80 kg/m    Physical Exam  Constitutional: She appears well-developed and well-nourished.  Eyes: Conjunctivae are normal.  Cardiovascular: Normal rate, regular rhythm, normal heart sounds and normal pulses.   Pulmonary/Chest: Effort normal and breath sounds normal. She has no wheezes. She has no rhonchi. She has no rales.  Abdominal: There is no CVA tenderness.  Genitourinary: There is no rash, tenderness or lesion on the right labia. There is no rash, tenderness or lesion on the left labia. Cervix exhibits no motion tenderness and no discharge. Right adnexum displays no mass, no tenderness and no fullness. Left adnexum displays no mass, no tenderness and no fullness. There is erythema in the vagina. No tenderness or bleeding in the vagina. No foreign body in the vagina. Vaginal discharge found.  Genitourinary Comments: Diffuse external erythema around the introitus and vulvovaginal erythema. No lesions. Discharge is curd like, thin ,and white.  not purulent.   Neurological: She is alert.  Skin: Skin is warm and dry.  Psychiatric: She has a normal mood and affect. Her speech  is normal and behavior is normal. Thought content normal.  Vitals reviewed.      Assessment & Walsh:   Problem List Items Addressed This Visit      Musculoskeletal and Integument   Vaginal itching - Primary    Symptoms on exam again very consistent with vaginal candidiasis. Thick, curd-like white discharge. Pending wet prep. Considering maintenance therapy the patient does not respond to 3 doses of Diflucan 3 days apart.      Relevant Medications   fluconazole (DIFLUCAN) 150 MG tablet   clotrimazole  (GYNE-LOTRIMIN 3) 2 % vaginal cream     Other   Mild single current episode of major depressive disorder (HCC)    New. Recent separation from husband. Trial of Zoloft. Follow-up in 6 weeks.      Relevant Medications   sertraline (ZOLOFT) 50 MG tablet       I am having [name] start on sertraline, fluconazole, and clotrimazole. I am also having her maintain her metroNIDAZOLE.   Meds ordered this encounter  Medications  . sertraline (ZOLOFT) 50 MG tablet    Sig: Take 1 tablet (50 mg total) by mouth at bedtime.    Dispense:  90 tablet    Refill:  3    Order Specific Question:   Supervising Provider    Answer:   Duncan DullULLO, TERESA L [[id number]]  . fluconazole (DIFLUCAN) 150 MG tablet    Sig: Take 1 tablet (150 mg total) by mouth every 3 (three) days. Total of 3 tablets.    Dispense:  3 tablet    Refill:  0    Order Specific Question:   Supervising Provider    Answer:   Darrick HuntsmanULLO, TERESA L [[id number]]  . clotrimazole (GYNE-LOTRIMIN 3) 2 % vaginal cream    Sig: Place 1 Applicatorful vaginally at bedtime. For duration 3 days.    Dispense:  21 g    Refill:  0    Order Specific Question:   Supervising Provider    Answer:   Sherlene ShamsULLO, TERESA L [[id number]]    Return precautions given.   Risks, benefits, and alternatives of the medications and treatment Walsh prescribed today were discussed, and patient expressed understanding.   Education regarding symptom management and diagnosis given to patient on AVS.  Continue to follow with Marissa Walsh for routine health maintenance.   Marissa Walsh.   Marissa PlowmanMargaret Damia Bobrowski, Walsh Total of 25 minutes spent with patient, greater than 50% of which was spent in discussion of  Depression.

## 2016-10-25 NOTE — Patient Instructions (Addendum)
For dry skin and itching on stomach, and use Vaseline and avoid long, hot showers Vaginal itching may use Gyne lotrimin for 3 days only.  You may also use oral diflucan, one tablet every 3 days. You may repeat this twice, every 3 days.  Start zoloft at bedtime for sadness.  Follow up 6 weeks.

## 2016-10-25 NOTE — Addendum Note (Signed)
Addended by: Elise BenneBOOTH, BROCK T on: 10/25/2016 09:17 AM   Modules accepted: Orders

## 2016-10-25 NOTE — Progress Notes (Signed)
Pre visit review using our clinic review tool, if applicable. No additional management support is needed unless otherwise documented below in the visit note. 

## 2016-10-26 LAB — CERVICOVAGINAL ANCILLARY ONLY: Wet Prep (BD Affirm): POSITIVE — AB

## 2016-10-29 ENCOUNTER — Other Ambulatory Visit: Payer: Self-pay | Admitting: Family

## 2016-10-29 DIAGNOSIS — N760 Acute vaginitis: Principal | ICD-10-CM

## 2016-10-29 DIAGNOSIS — B9689 Other specified bacterial agents as the cause of diseases classified elsewhere: Secondary | ICD-10-CM

## 2016-10-29 MED ORDER — METRONIDAZOLE 500 MG PO TABS: 500.0000 mg | ORAL_TABLET | Freq: Two times a day (BID) | ORAL | 0 refills | Status: DC

## 2016-10-29 NOTE — Progress Notes (Signed)
ordered

## 2016-12-10 ENCOUNTER — Ambulatory Visit: Payer: Self-pay | Admitting: Family

## 2017-04-08 ENCOUNTER — Other Ambulatory Visit (HOSPITAL_COMMUNITY)
Admission: RE | Admit: 2017-04-08 | Discharge: 2017-04-08 | Disposition: A | Discharge status: Home / Self Care | Payer: Self-pay | Source: Ambulatory Visit | Attending: Family | Admitting: Family

## 2017-04-08 ENCOUNTER — Encounter: Payer: Self-pay | Admitting: Family

## 2017-04-08 ENCOUNTER — Ambulatory Visit (INDEPENDENT_AMBULATORY_CARE_PROVIDER_SITE_OTHER): Payer: Self-pay | Admitting: Family

## 2017-04-08 ENCOUNTER — Telehealth: Payer: Self-pay

## 2017-04-08 VITALS — BP 100/74 | HR 70 | Temp 98.6°F | Ht 62.25 in | Wt 133.6 lb

## 2017-04-08 DIAGNOSIS — N898 Other specified noninflammatory disorders of vagina: Secondary | ICD-10-CM

## 2017-04-08 DIAGNOSIS — B9689 Other specified bacterial agents as the cause of diseases classified elsewhere: Secondary | ICD-10-CM | POA: Insufficient documentation

## 2017-04-08 DIAGNOSIS — L298 Other pruritus: Secondary | ICD-10-CM

## 2017-04-08 DIAGNOSIS — N83209 Unspecified ovarian cyst, unspecified side: Secondary | ICD-10-CM

## 2017-04-08 DIAGNOSIS — N83202 Unspecified ovarian cyst, left side: Secondary | ICD-10-CM | POA: Insufficient documentation

## 2017-04-08 DIAGNOSIS — F320 Major depressive disorder, single episode, mild: Secondary | ICD-10-CM

## 2017-04-08 NOTE — Assessment & Plan Note (Addendum)
New ( to me). Ordered US. No pelvic pain.

## 2017-04-08 NOTE — Assessment & Plan Note (Signed)
Improved. Will continue to monitor.

## 2017-04-08 NOTE — Patient Instructions (Addendum)
Let's await lab results prior to treatment  We will call you with an appointment for vaginal ultrasound   Follow up 3 months

## 2017-04-08 NOTE — Progress Notes (Signed)
Pre visit review using our clinic review tool, if applicable. No additional management support is needed unless otherwise documented below in the visit note. 

## 2017-04-08 NOTE — Assessment & Plan Note (Addendum)
Recurrent. Unsure why not responding to flagyl, diflucan.Never tried vaginal cream, clotrimazole. Alternative differentials include vulvar dermatitis, vulvovaginal atrophy. Pending repeat wet prep today and will await prior to treatment. May consider medium potency hydrocortisone 1% ointment for itching symptom based on wet prep.

## 2017-04-08 NOTE — Telephone Encounter (Signed)
Order has been re placed!

## 2017-04-08 NOTE — Progress Notes (Signed)
Subjective:    Patient ID: Marissa Walsh, female    DOB: 17-Nov-1980, 36 y.o.   MRN: [id number]  CC: Marissa Walsh is a 36 y.o. female who presents today for follow up.   HPI: Depression - off of zoloft. Stopped as didn't feel like she needed anymore. No thoughts of hurting herself or anyone else. Still separated from husband  Complains of today vaginal itching. Tried oral diflucan and flagyl without resolve. White discharge. No changes in vaginal discharge.  No concern for STDs. No new sexual partners. No dysuria, abdominal pain, fever.   Would also like to evaluate ovaries. States left ovary cyst from 2016.  No abdominal swelling. Would like to see if grown in size.       HISTORY:  Past Medical History:  Diagnosis Date  . Asthma    No past surgical history on file. Family History  Problem Relation Age of Onset  . Hyperlipidemia Mother   . Hypertension Mother   . Diabetes Mother   . Cancer Father [id number]       colon cancer    Allergies: Alcohol-sulfur [sulfur] No current outpatient prescriptions on file prior to visit.   No current facility-administered medications on file prior to visit.     Social History  Substance Use Topics  . Smoking status: Never Smoker  . Smokeless tobacco: Never Used  . Alcohol use No    Review of Systems  Constitutional: Negative for chills and fever.  Respiratory: Negative for cough.   Cardiovascular: Negative for chest pain and palpitations.  Gastrointestinal: Negative for abdominal distention, abdominal pain, nausea and vomiting.  Genitourinary: Negative for dysuria, genital sores, hematuria, pelvic pain and vaginal discharge.      Objective:    BP 100/74   Pulse 70   Temp 98.6 F (37 C) (Oral)   Ht 5' 2.25" (1.581 m)   Wt 133 lb 9.6 oz (60.6 kg)   LMP  (LMP Unknown)   SpO2 96%   BMI 24.24 kg/m  BP Readings from Last 3 Encounters:  04/08/17 100/74  10/25/16 100/68  07/11/16 102/76   Wt Readings from Last 3 Encounters:  04/08/17  133 lb 9.6 oz (60.6 kg)  10/25/16 131 lb 3.2 oz (59.5 kg)  07/11/16 127 lb (57.6 kg)    Physical Exam  Constitutional: She appears well-developed and well-nourished.  Eyes: Conjunctivae are normal.  Cardiovascular: Normal rate, regular rhythm, normal heart sounds and normal pulses.   Pulmonary/Chest: Effort normal and breath sounds normal. She has no wheezes. She has no rhonchi. She has no rales.  Abdominal: There is no CVA tenderness.  Genitourinary: There is no rash, tenderness or lesion on the right labia. There is no rash, tenderness or lesion on the left labia. Cervix exhibits no motion tenderness and no discharge. Right adnexum displays no mass, no tenderness and no fullness. Left adnexum displays no mass, no tenderness and no fullness. No erythema, tenderness or bleeding in the vagina. No foreign body in the vagina. No vaginal discharge found.  Genitourinary Comments: No vulvovaginal erythema. No fissures, scaling. No lesion, ulcer, mass seen on vulva. Discharge is thin and clear, not purulent.   Neurological: She is alert.  Skin: Skin is warm and dry.  Psychiatric: She has a normal mood and affect. Her speech is normal and behavior is normal. Thought content normal.  Vitals reviewed.      Assessment & Plan:   Problem List Items Addressed This Visit      Musculoskeletal and  Integument   Vaginal itching - Primary    Recurrent. Unsure why not responding to flagyl, diflucan.Never tried vaginal cream, clotrimazole. Alternative differentials include vulvar dermatitis, vulvovaginal atrophy. Pending repeat wet prep today and will await prior to treatment. May consider medium potency hydrocortisone 1% ointment for itching symptom based on wet prep.      Relevant Orders   Cervicovaginal ancillary only     Genitourinary   Cyst of left ovary    New ( to me). Ordered US. No pelvic pain.       Relevant Orders   US Transvaginal Non-OB     Other   Mild single current episode of major  depressive disorder (HCC)    Improved. Will continue to monitor.           I have discontinued Ms. Hoopingarner's sertraline, fluconazole, and metroNIDAZOLE.   No orders of the defined types were placed in this encounter.   Return precautions given.   Risks, benefits, and alternatives of the medications and treatment plan prescribed today were discussed, and patient expressed understanding.   Education regarding symptom management and diagnosis given to patient on AVS.  Continue to follow with Allegra Grana, FNP for routine health maintenance.   Marissa Kava and I agreed with plan.   Rennie Plowman, FNP

## 2017-04-09 LAB — CERVICOVAGINAL ANCILLARY ONLY: Wet Prep (BD Affirm): POSITIVE — AB

## 2017-04-10 ENCOUNTER — Other Ambulatory Visit: Payer: Self-pay | Admitting: Family

## 2017-04-10 ENCOUNTER — Telehealth: Payer: Self-pay | Admitting: *Deleted

## 2017-04-10 DIAGNOSIS — B9689 Other specified bacterial agents as the cause of diseases classified elsewhere: Secondary | ICD-10-CM

## 2017-04-10 DIAGNOSIS — B372 Candidiasis of skin and nail: Secondary | ICD-10-CM

## 2017-04-10 DIAGNOSIS — N760 Acute vaginitis: Principal | ICD-10-CM

## 2017-04-10 MED ORDER — METRONIDAZOLE 500 MG PO TABS: 500.0000 mg | ORAL_TABLET | Freq: Two times a day (BID) | ORAL | 0 refills | Status: DC

## 2017-04-10 NOTE — Telephone Encounter (Signed)
Please advise 

## 2017-04-10 NOTE — Telephone Encounter (Signed)
[facility] stated that patient was under the impression that she was to receive a script for a cream along with the metronidazole.  Please advise

## 2017-04-10 NOTE — Telephone Encounter (Signed)
No cream since no yeast seen. If not better on flaygl, advise pt to let me know and we can  Give topical cream for empiric yeast infection.   Only PO.   Please let pharmacy know.

## 2017-04-17 ENCOUNTER — Telehealth: Payer: Self-pay | Admitting: Family

## 2017-04-17 NOTE — Telephone Encounter (Signed)
Pt is requesting cream, issue is not better.   Thanks

## 2017-04-17 NOTE — Telephone Encounter (Signed)
Please advise 

## 2017-04-18 ENCOUNTER — Ambulatory Visit: Payer: Self-pay

## 2017-04-18 ENCOUNTER — Ambulatory Visit
Admission: RE | Admit: 2017-04-18 | Discharge: 2017-04-18 | Disposition: A | Discharge status: Home / Self Care | Payer: Self-pay | Source: Ambulatory Visit | Attending: Family | Admitting: Family

## 2017-04-18 ENCOUNTER — Other Ambulatory Visit: Payer: Self-pay | Admitting: Family

## 2017-04-18 DIAGNOSIS — N83202 Unspecified ovarian cyst, left side: Secondary | ICD-10-CM

## 2017-04-18 DIAGNOSIS — N83209 Unspecified ovarian cyst, unspecified side: Secondary | ICD-10-CM

## 2017-04-18 MED ORDER — CLOTRIMAZOLE 1 % EX CREA: 1.0000 "application " | TOPICAL_CREAM | Freq: Two times a day (BID) | CUTANEOUS | 1 refills | Status: DC

## 2017-04-18 NOTE — Progress Notes (Signed)
No pelvic, pain, no pain at all, she is coming tomorrow for labs and urine. thanks

## 2017-04-18 NOTE — Telephone Encounter (Signed)
Spoke with the patient she will come tomorrow for labs, reviewed note with her, verbalized understanding. thanks

## 2017-04-18 NOTE — Telephone Encounter (Signed)
Patient has been notified

## 2017-04-18 NOTE — Progress Notes (Unsigned)
Call pt  Also see prior telephone in which I sent in new rx   US transvaginal shows left ovarian cyst which was known  Ensure she has NO pelvic pain  I would like to ensure not an ectopic and have ordered hcg urine and blood. Please have her come asap  Any pain, advise ed

## 2017-04-18 NOTE — Telephone Encounter (Signed)
Call pt  US shows left cyst on ovary which is known Pended hcg urine and blood to ensure no ectopic; advise pt to come in asap for these If any pelvic pain, she needs to go to ed

## 2017-04-18 NOTE — Telephone Encounter (Signed)
Call pt  Sent in cream for suspect yeast infection around perinuem  Let pt know   Advise her to let me know if not better as if not , I would like to consult derm and will place referral

## 2017-04-19 ENCOUNTER — Other Ambulatory Visit (INDEPENDENT_AMBULATORY_CARE_PROVIDER_SITE_OTHER): Payer: Self-pay

## 2017-04-19 DIAGNOSIS — N83202 Unspecified ovarian cyst, left side: Secondary | ICD-10-CM

## 2017-04-19 NOTE — Telephone Encounter (Signed)
I spoke with lab tach, I had spent approximately trying to explain to her why she needed labs today on the phone.  I will let PCP know about barriers, thanks

## 2017-04-19 NOTE — Telephone Encounter (Signed)
Pt came in for labs today (bloodwork only) & didn't understand why we were drawing blood for a pregnancy test. Pt did not understand the last message she received. She has a slight language barrier. I had to say "pregnany test" to her phone so it would show her the definition.

## 2017-04-19 NOTE — Telephone Encounter (Signed)
Noted, thanks for your help! 

## 2017-04-20 LAB — HCG, SERUM, QUALITATIVE: PREG SERUM: NEGATIVE

## 2017-04-22 ENCOUNTER — Other Ambulatory Visit: Payer: Self-pay | Admitting: Family

## 2017-04-22 DIAGNOSIS — N83202 Unspecified ovarian cyst, left side: Secondary | ICD-10-CM

## 2017-10-24 ENCOUNTER — Telehealth: Payer: Self-pay | Admitting: Internal Medicine

## 2017-10-24 NOTE — Telephone Encounter (Signed)
Pt wanted to know if her menstrual comes can she still come in for her appt? She's coming in for vaginal itching. Please advise? Thank you!  Call pt @ [phone].

## 2017-10-24 NOTE — Telephone Encounter (Signed)
If uncomfortable yes but best to do when no cycle if coming for pap but we can still do so keep appt    Thanks TMS

## 2017-10-24 NOTE — Telephone Encounter (Signed)
Patient advised of below and verbalized understanding.  

## 2017-10-25 ENCOUNTER — Ambulatory Visit (INDEPENDENT_AMBULATORY_CARE_PROVIDER_SITE_OTHER): Payer: Self-pay | Admitting: Internal Medicine

## 2017-10-25 ENCOUNTER — Encounter: Payer: Self-pay | Admitting: Internal Medicine

## 2017-10-25 VITALS — BP 100/60 | HR 81 | Temp 98.0°F | Resp 16 | Ht 62.5 in | Wt 124.5 lb

## 2017-10-25 DIAGNOSIS — L309 Dermatitis, unspecified: Secondary | ICD-10-CM

## 2017-10-25 DIAGNOSIS — E559 Vitamin D deficiency, unspecified: Secondary | ICD-10-CM

## 2017-10-25 DIAGNOSIS — N898 Other specified noninflammatory disorders of vagina: Secondary | ICD-10-CM

## 2017-10-25 DIAGNOSIS — N83202 Unspecified ovarian cyst, left side: Secondary | ICD-10-CM

## 2017-10-25 MED ORDER — HYDROCORTISONE 2.5 % EX CREA: TOPICAL_CREAM | Freq: Two times a day (BID) | CUTANEOUS | 0 refills | Status: DC

## 2017-10-25 MED ORDER — FLUCONAZOLE 150 MG PO TABS: 150.0000 mg | ORAL_TABLET | Freq: Once | ORAL | 0 refills | Status: AC

## 2017-10-25 MED ORDER — HYDROXYZINE HCL 25 MG PO TABS
25.0000 mg | ORAL_TABLET | Freq: Three times a day (TID) | ORAL | 0 refills | Status: DC | PRN
Start: 2017-10-25 — End: 2020-01-12

## 2017-10-25 MED ORDER — CLOTRIMAZOLE 1 % EX CREA: 1.0000 "application " | TOPICAL_CREAM | Freq: Two times a day (BID) | CUTANEOUS | 0 refills | Status: DC

## 2017-10-25 NOTE — Patient Instructions (Signed)
Please follow up in 1-2 weeks fasting labs  F/u 1 month otherwise

## 2017-10-25 NOTE — Progress Notes (Signed)
Chief Complaint  Patient presents with  . Vaginal Itching   F/u  C/o external vaginal itching at least since 05/2016. No new skin, laundry, or perfume products. She reports every time she comes in wet prep is done but this does not help the problem. Skin is irritated around the vaginal region and red.  It is causing her not to sleep it is worse after a shower.    Review of Systems  Constitutional: Negative for weight loss.  Respiratory: Negative for shortness of breath.   Cardiovascular: Negative for chest pain.  Genitourinary:       GU itching   Past Medical History:  Diagnosis Date  . Asthma   . Bacterial vaginosis   . Ovarian cyst   . Vulvar dermatitis    History reviewed. No pertinent surgical history. Family History  Problem Relation Age of Onset  . Hyperlipidemia Mother   . Hypertension Mother   . Diabetes Mother   . Cancer Father [id number]       colon cancer   Social History   Socioeconomic History  . Marital status: Married    Spouse name: Not on file  . Number of children: Not on file  . Years of education: Not on file  . Highest education level: Not on file  Social Needs  . Financial resource strain: Not on file  . Food insecurity - worry: Not on file  . Food insecurity - inability: Not on file  . Transportation needs - medical: Not on file  . Transportation needs - non-medical: Not on file  Occupational History  . Not on file  Tobacco Use  . Smoking status: Never Smoker  . Smokeless tobacco: Never Used  Substance and Sexual Activity  . Alcohol use: No  . Drug use: No  . Sexual activity: No  Other Topics Concern  . Not on file  Social History Narrative   Working [employer].    Mandarin [place];    Has been in [place] for 2 years.    Lives with husband.   3 boys; no complications for pregnancies.      No outpatient medications have been marked as taking for the 10/25/17 encounter (Office Visit) with McLean-Scocuzza, Pasty Spillersracy N, MD.    Allergies  Allergen Reactions  . Alcohol-Sulfur [Sulfur] Itching   No results found for this or any previous visit (from the past 2160 hour(s)). Objective  Body mass index is 22.41 kg/m. Wt Readings from Last 3 Encounters:  10/25/17 124 lb 8 oz (56.5 kg)  04/08/17 133 lb 9.6 oz (60.6 kg)  10/25/16 131 lb 3.2 oz (59.5 kg)   Temp Readings from Last 3 Encounters:  10/25/17 98 F (36.7 C) (Oral)  04/08/17 98.6 F (37 C) (Oral)  10/25/16 98.1 F (36.7 C) (Oral)   BP Readings from Last 3 Encounters:  10/25/17 100/60  04/08/17 100/74  10/25/16 100/68   Pulse Readings from Last 3 Encounters:  10/25/17 81  04/08/17 70  10/25/16 83   O2 sat room air 99%  Physical Exam  Constitutional: She is oriented to person, place, and time and well-developed, well-nourished, and in no distress. Vital signs are normal.  HENT:  Head: Normocephalic and atraumatic.  Eyes: Conjunctivae are normal. Pupils are equal, round, and reactive to light.  Cardiovascular: Normal rate, regular rhythm and normal heart sounds.  Pulmonary/Chest: Effort normal and breath sounds normal.  Abdominal: Soft. Bowel sounds are normal. There is no tenderness.  Genitourinary: Vulva exhibits erythema and rash.  Genitourinary Comments: Hyperpigmentation, redness in external vaginal regions and b/l groin  Neurological: She is alert and oriented to person, place, and time. Gait normal.  Skin: Skin is warm, dry and intact. Rash noted.  Psychiatric: Mood, memory, affect and judgment normal.  Nursing note and vitals reviewed.   Assessment   1. Vulvar itching likely dermatitis  2. Vit D def  3. HM 4. Left ovarian hemorrhagic cyst reviewed [place] with pt 04/18/17  Plan  1. Fluconazole x 1 150, clotrimazole bid prn and hc 2.5 bid prn. Hydroxyzine prn tid if cant afford can try zyrtec/claritin qhs  2. Need to repeat no labs since 2017 pt is self pay  3.  Need to disc vxs at f/u Tdap, flu  Pap 07/11/16 neg pap neg HPV H/o  recurrent BV Declines to do labs until cost CMET, CBC, lipid, UA, TSH ,T4, vit D 4. Disc repeat US as she missed vs OB/GYN referral to further address she is c/w cost and does not want to address at this time   Provider: Dr. French Ana McLean-Scocuzza-[facility] Medicine

## 2017-11-09 ENCOUNTER — Other Ambulatory Visit: Payer: Self-pay | Admitting: Internal Medicine

## 2017-11-09 DIAGNOSIS — E559 Vitamin D deficiency, unspecified: Secondary | ICD-10-CM

## 2017-11-09 DIAGNOSIS — Z1329 Encounter for screening for other suspected endocrine disorder: Secondary | ICD-10-CM

## 2017-11-09 DIAGNOSIS — Z13220 Encounter for screening for lipoid disorders: Secondary | ICD-10-CM

## 2017-11-09 DIAGNOSIS — Z0000 Encounter for general adult medical examination without abnormal findings: Secondary | ICD-10-CM

## 2017-11-13 ENCOUNTER — Other Ambulatory Visit: Payer: Self-pay

## 2018-08-19 ENCOUNTER — Ambulatory Visit: Payer: BLUE CROSS/BLUE SHIELD | Admitting: Family Medicine

## 2018-08-19 ENCOUNTER — Encounter: Payer: Self-pay | Admitting: Family Medicine

## 2018-08-19 ENCOUNTER — Other Ambulatory Visit (HOSPITAL_COMMUNITY)
Admission: RE | Admit: 2018-08-19 | Discharge: 2018-08-19 | Disposition: A | Discharge status: Home / Self Care | Payer: BLUE CROSS/BLUE SHIELD | Source: Ambulatory Visit | Attending: Family Medicine | Admitting: Family Medicine

## 2018-08-19 VITALS — BP 98/68 | HR 72 | Temp 98.4°F | Ht 62.5 in | Wt 130.6 lb

## 2018-08-19 DIAGNOSIS — L309 Dermatitis, unspecified: Secondary | ICD-10-CM | POA: Diagnosis present

## 2018-08-19 DIAGNOSIS — N898 Other specified noninflammatory disorders of vagina: Secondary | ICD-10-CM | POA: Insufficient documentation

## 2018-08-19 DIAGNOSIS — E559 Vitamin D deficiency, unspecified: Secondary | ICD-10-CM | POA: Diagnosis not present

## 2018-08-19 DIAGNOSIS — Z113 Encounter for screening for infections with a predominantly sexual mode of transmission: Secondary | ICD-10-CM

## 2018-08-19 DIAGNOSIS — D649 Anemia, unspecified: Secondary | ICD-10-CM

## 2018-08-19 DIAGNOSIS — B9689 Other specified bacterial agents as the cause of diseases classified elsewhere: Secondary | ICD-10-CM

## 2018-08-19 DIAGNOSIS — N760 Acute vaginitis: Secondary | ICD-10-CM

## 2018-08-19 LAB — COMPREHENSIVE METABOLIC PANEL
ALK PHOS: 41 U/L (ref 39–117)
ALT: 14 U/L (ref 0–35)
AST: 15 U/L (ref 0–37)
Albumin: 4.2 g/dL (ref 3.5–5.2)
BILIRUBIN TOTAL: 0.8 mg/dL (ref 0.2–1.2)
BUN: 14 mg/dL (ref 6–23)
CALCIUM: 9.2 mg/dL (ref 8.4–10.5)
CO2: 25 meq/L (ref 19–32)
CREATININE: 0.48 mg/dL (ref 0.40–1.20)
Chloride: 104 mEq/L (ref 96–112)
GFR: 153.58 mL/min (ref >60.00)
Glucose, Bld: 87 mg/dL (ref 70–99)
Potassium: 4.1 mEq/L (ref 3.5–5.1)
Sodium: 138 mEq/L (ref 135–145)
TOTAL PROTEIN: 7.2 g/dL (ref 6.0–8.3)

## 2018-08-19 LAB — CBC WITH DIFFERENTIAL/PLATELET
Basophils Absolute: 0 K/uL (ref 0.0–0.1)
Basophils Relative: 0.5 % (ref 0.0–3.0)
Eosinophils Absolute: 0.2 K/uL (ref 0.0–0.7)
Eosinophils Relative: 3.4 % (ref 0.0–5.0)
HCT: 34.2 % — ABNORMAL LOW (ref 36.0–46.0)
Hemoglobin: 11.2 g/dL — ABNORMAL LOW (ref 12.0–15.0)
Lymphocytes Relative: 22.3 % (ref 12.0–46.0)
Lymphs Abs: 1.3 K/uL (ref 0.7–4.0)
MCHC: 32.6 g/dL (ref 30.0–36.0)
MCV: 67.3 fl — ABNORMAL LOW (ref 78.0–100.0)
Monocytes Absolute: 0.4 K/uL (ref 0.1–1.0)
Monocytes Relative: 7.3 % (ref 3.0–12.0)
Neutro Abs: 3.8 K/uL (ref 1.4–7.7)
Neutrophils Relative %: 66.5 % (ref 43.0–77.0)
Platelets: 218 K/uL (ref 150.0–400.0)
RBC: 5.08 Mil/uL (ref 3.87–5.11)
RDW: 14.9 % (ref 11.5–15.5)
WBC: 5.7 K/uL (ref 4.0–10.5)

## 2018-08-19 LAB — POCT URINALYSIS DIPSTICK
Bilirubin, UA: NEGATIVE
Glucose, UA: NEGATIVE
Ketones, UA: NEGATIVE
Leukocytes, UA: NEGATIVE
NITRITE UA: NEGATIVE
PH UA: 6 (ref 5.0–8.0)
PROTEIN UA: NEGATIVE
RBC UA: NEGATIVE
UROBILINOGEN UA: 0.2 U/dL

## 2018-08-19 LAB — B12 AND FOLATE PANEL
Folate: 14 ng/mL
Vitamin B-12: 456 pg/mL (ref 211–911)

## 2018-08-19 LAB — VITAMIN D 25 HYDROXY (VIT D DEFICIENCY, FRACTURES): VITD: 15.4 ng/mL — AB (ref 30.00–100.00)

## 2018-08-19 LAB — POCT URINE PREGNANCY: Preg Test, Ur: NEGATIVE

## 2018-08-19 LAB — TSH: TSH: 1.62 u[IU]/mL (ref 0.35–4.50)

## 2018-08-19 MED ORDER — FLUCONAZOLE 150 MG PO TABS: 150.0000 mg | ORAL_TABLET | Freq: Once | ORAL | 0 refills | Status: AC

## 2018-08-19 MED ORDER — FEXOFENADINE HCL 180 MG PO TABS: 180.0000 mg | ORAL_TABLET | Freq: Every day | ORAL | 5 refills | Status: DC

## 2018-08-19 MED ORDER — CLOTRIMAZOLE-BETAMETHASONE 1-0.05 % EX CREA: 1.0000 "application " | TOPICAL_CREAM | Freq: Two times a day (BID) | CUTANEOUS | 0 refills | Status: DC

## 2018-08-19 NOTE — Progress Notes (Signed)
Subjective:    Patient ID: Marissa Walsh, female    DOB: Jun 09, 1981, 37 y.o.   MRN: [id number]  HPI  Presents to clinic c/o vaginal itching for a few months. She has had this issue off and on since 2017. States it is not actually inside her vagina, it is the skin around it and in her groin area. She has had this issue before, used a topical cream and it improved for a period of time, but then the itching returned.  She does not think there is risk for STD, but is not sure. Menses regular.   No abd pain, nausea, vomiting or diarrhea. No fever or chills.   Patient Active Problem List   Diagnosis Date Noted  . Vitamin D deficiency 10/25/2017  . Cyst of left ovary 04/08/2017  . Mild single current episode of major depressive disorder (HCC) 10/25/2016  . Anemia 07/12/2016  . Routine general medical examination at a health care facility 05/08/2016  . Vaginal itching 05/08/2016   Social History   Tobacco Use  . Smoking status: Never Smoker  . Smokeless tobacco: Never Used  Substance Use Topics  . Alcohol use: No   Review of Systems  Constitutional: Negative for chills, fatigue and fever.  HENT: Negative for congestion, ear pain, sinus pain and sore throat.   Eyes: Negative.   Respiratory: Negative for cough, shortness of breath and wheezing.   Cardiovascular: Negative for chest pain, palpitations and leg swelling.  Gastrointestinal: Negative for abdominal pain, diarrhea, nausea and vomiting.  Genitourinary: Negative for dysuria, frequency and urgency. +vaginal itch Musculoskeletal: Negative for arthralgias and myalgias.  Skin: Negative for color change, pallor and rash. Chronic itching of skin in groin area.  Neurological: Negative for syncope, light-headedness and headaches.  Psychiatric/Behavioral: The patient is not nervous/anxious.       Objective:   Physical Exam  Constitutional: She is oriented to person, place, and time. No distress.  HENT:  Head: Normocephalic and  atraumatic.  Eyes: Conjunctivae and EOM are normal. No scleral icterus.  Neck: Neck supple. No tracheal deviation present.  Cardiovascular: Normal rate and regular rhythm.  Pulmonary/Chest: Effort normal and breath sounds normal. No respiratory distress.  Abdominal: Soft. Bowel sounds are normal. There is no tenderness. Hernia confirmed negative in the right inguinal area and confirmed negative in the left inguinal area.  Genitourinary: Rectum normal.    Pelvic exam was performed with patient supine. There is no rash, tenderness, lesion or injury on the right labia. There is no rash, tenderness, lesion or injury on the left labia. Uterus is not tender. Cervix exhibits no discharge. Right adnexum displays no tenderness. Left adnexum displays no tenderness.  Genitourinary Comments: Dry, faint red, flaky rash in bilateral groin area. Thin white vaginal discharge present, could be normal finding.   Musculoskeletal: She exhibits no edema.  Lymphadenopathy: No inguinal adenopathy noted on the right or left side.  Neurological: She is alert and oriented to person, place, and time.  Skin: Skin is warm and dry. No pallor.  Psychiatric: She has a normal mood and affect. Her behavior is normal.  Nursing note and vitals reviewed.     Vitals:   08/19/18 0908  BP: 98/68  Pulse: 72  Temp: 98.4 F (36.9 C)  SpO2: 98%   Assessment & Plan:   Vaginal itching/thin white vaginal discharge- overall internal vaginal exam is normal.  Thin white discharge seen could be a normal finding.  Due to description of itching in the area,  will cover with one-time dose of Diflucan to treat possible yeast infection.  Urinalysis unremarkable, will also send urine to lab for urine culture to rule out UTI.  Vulvar dermatitis-due to areas of rash, I suspect a dermatitis is at play causing the rash and itch.  Patient will use topical Lotrisone cream to help improve dermatitis symptoms.  Patient will be screened for STDs  including gonorrhea, chlamydia, trichomonas, yeast, Gardnerella, syphilis and HIV.  Lab work including CBC, CMP, thyroid, vitamin D and B12 also drawn today due to chronic incidence of itching.  Patient advised to follow-up with PCP for annual physical exam.  Also discussed that his itching continues to persist we can have her see either GYN specialist or dermatologist.

## 2018-08-20 LAB — URINE CYTOLOGY ANCILLARY ONLY
Chlamydia: NEGATIVE
Neisseria Gonorrhea: NEGATIVE
Trichomonas: NEGATIVE

## 2018-08-20 LAB — URINE CULTURE
MICRO NUMBER:: 91392270
Result:: NO GROWTH
SPECIMEN QUALITY: ADEQUATE

## 2018-08-20 LAB — RPR: RPR Ser Ql: NONREACTIVE

## 2018-08-20 LAB — HIV ANTIBODY (ROUTINE TESTING W REFLEX): HIV 1&2 Ab, 4th Generation: NONREACTIVE

## 2018-08-22 LAB — URINE CYTOLOGY ANCILLARY ONLY
Bacterial vaginitis: POSITIVE — AB
Candida vaginitis: NEGATIVE

## 2018-08-22 MED ORDER — VITAMIN D (ERGOCALCIFEROL) 1.25 MG (50000 UNIT) PO CAPS: 50000.0000 [IU] | ORAL_CAPSULE | ORAL | 1 refills | Status: DC

## 2018-08-22 MED ORDER — METRONIDAZOLE 0.75 % VA GEL: 1.0000 | Freq: Every day | VAGINAL | 0 refills | Status: AC

## 2018-08-22 NOTE — Addendum Note (Signed)
Addended by: Leanora CoverGUSE, Takelia Urieta on: 08/22/2018 12:23 PM   Modules accepted: Orders

## 2018-08-22 NOTE — Addendum Note (Signed)
Addended by: Leanora CoverGUSE, York Valliant on: 08/22/2018 09:26 AM   Modules accepted: Orders

## 2018-10-31 ENCOUNTER — Ambulatory Visit: Payer: BLUE CROSS/BLUE SHIELD | Admitting: Family Medicine

## 2018-10-31 ENCOUNTER — Encounter: Payer: Self-pay | Admitting: Family Medicine

## 2018-10-31 ENCOUNTER — Ambulatory Visit (INDEPENDENT_AMBULATORY_CARE_PROVIDER_SITE_OTHER): Payer: BLUE CROSS/BLUE SHIELD | Admitting: Family Medicine

## 2018-10-31 DIAGNOSIS — R6889 Other general symptoms and signs: Secondary | ICD-10-CM | POA: Diagnosis not present

## 2018-10-31 MED ORDER — IBUPROFEN 600 MG PO TABS: 600.0000 mg | ORAL_TABLET | Freq: Three times a day (TID) | ORAL | 0 refills | Status: DC | PRN

## 2018-10-31 MED ORDER — ALBUTEROL SULFATE HFA 108 (90 BASE) MCG/ACT IN AERS: 1.0000 | INHALATION_SPRAY | Freq: Four times a day (QID) | RESPIRATORY_TRACT | 0 refills | Status: DC | PRN

## 2018-10-31 MED ORDER — OSELTAMIVIR PHOSPHATE 75 MG PO CAPS: 75.0000 mg | ORAL_CAPSULE | Freq: Two times a day (BID) | ORAL | 0 refills | Status: DC

## 2018-10-31 NOTE — Progress Notes (Signed)
Office visit completed with the aid of interpreter [phone].  Flulike symptoms.  Cough and congestion, ST, HA.  3 days duration.  No vomiting, no diarrhea. No sputum.  No ear pain.  Chest sore with coughing.  Frontal HA.  Diffuse muscle aches.  No pain with deep breath. Some wheeze.  She is sore from coughing with pain just inferior to the xiphoid in the midline.  Meds, vitals, and allergies reviewed.   ROS: Per HPI unless specifically indicated in ROS section   GEN: nad, alert and oriented HEENT: mucous membranes moist, tm w/o erythema, nasal exam w/o erythema, clear discharge noted,  OP with cobblestoning, cough noted NECK: supple w/o LA CV: rrr.   PULM: ctab, no inc wob EXT: no edema SKIN: Well-perfused

## 2018-11-02 DIAGNOSIS — R6889 Other general symptoms and signs: Secondary | ICD-10-CM | POA: Insufficient documentation

## 2018-11-02 NOTE — Assessment & Plan Note (Signed)
Presumed flu.  Discussed with patient about options.  Would use ibuprofen for muscle aches and fever.  Reasonable to use albuterol for cough.  Would start Tamiflu even though she is 3 days into symptoms as she may still have some benefit.  Update [place] as needed.  At this point still okay for outpatient follow-up.  Lungs are clear.  She does not have evidence of pneumonia on exam.

## 2018-11-14 ENCOUNTER — Other Ambulatory Visit: Payer: BLUE CROSS/BLUE SHIELD

## 2018-11-18 ENCOUNTER — Other Ambulatory Visit: Payer: Self-pay | Admitting: Family Medicine

## 2018-11-19 NOTE — Telephone Encounter (Signed)
Call patient See that she recently had flulike symptoms.  I suspect this is when the albuterol was started.  Otherwise if the patient is having persistent cough, as of breath, wheezing, she needs to be seen. Please let her know that I sent in a refill  for albuterol.

## 2018-11-20 NOTE — Telephone Encounter (Signed)
I tried to cal patient, but there was no VM box set up to leave message.

## 2019-02-05 ENCOUNTER — Other Ambulatory Visit: Payer: Self-pay | Admitting: Family Medicine

## 2019-02-05 DIAGNOSIS — E559 Vitamin D deficiency, unspecified: Secondary | ICD-10-CM

## 2019-06-23 ENCOUNTER — Other Ambulatory Visit: Payer: Self-pay | Admitting: Family Medicine

## 2019-06-23 ENCOUNTER — Other Ambulatory Visit: Payer: Self-pay | Admitting: Family

## 2019-06-23 DIAGNOSIS — L309 Dermatitis, unspecified: Secondary | ICD-10-CM

## 2019-06-23 NOTE — Telephone Encounter (Signed)
Copied from [place] [id number]. Topic: Quick Communication - Rx Refill/Question >> Jun 23, 2019 10:17 AM Rainey Pines A wrote: Medication: clotrimazole-betamethasone (LOTRISONE) cream [Pharmacy Med Name: CLOTRIMAZOLE-BETAMETHASONE CRM] (Pharmacy stated that they need new prescription sent over because patient no longer ahas any refills. Pharmacy stated that they faxed office for  request.)  Has the patient contacted their pharmacy? Yes (Agent: If no, request that the patient contact the pharmacy for the refill.) (Agent: If yes, when and what did the pharmacy advise?)Contact PCP [facility] [place] (Phone) [phone] (Fax)   Preferred Pharmacy (with phone number or street name):   Agent: Please be advised that RX refills may take up to 3 business days. We ask that you follow-up with your pharmacy.

## 2019-06-23 NOTE — Telephone Encounter (Signed)
Requested medication (s) are due for refill today: yes  Requested medication (s) are on the active medication list: yes  Last refill:  08/20/2019  Future visit scheduled: no  Notes to clinic:  Need new script sent over   Requested Prescriptions  Pending Prescriptions Disp Refills   clotrimazole-betamethasone (LOTRISONE) cream 30 g 0    Sig: Apply 1 application topically 2 (two) times daily. To affected areas. Do NOT apply in vagina, only on external groin area that is affected.     Off-Protocol Failed - 06/23/2019 10:26 AM      Failed - Medication not assigned to a protocol, review manually.      Passed - Valid encounter within last 12 months    Recent Outpatient Visits          10 months ago Vaginal itching   [facility] Guse, Jacquelynn Cree, FNP   1 year ago Vulvar dermatitis   [facility] Winifred McLean-Scocuzza, Nino Glow, MD   2 years ago Vaginal itching   [place], Yvetta Coder, FNP   2 years ago Vaginal itching   [facility] [place], Yvetta Coder, [place] [facility]   2 years ago Routine physical examination   [facility] [place], Yvetta Coder, [place]

## 2019-08-17 ENCOUNTER — Other Ambulatory Visit: Payer: Self-pay | Admitting: Family Medicine

## 2019-08-17 DIAGNOSIS — E559 Vitamin D deficiency, unspecified: Secondary | ICD-10-CM

## 2019-12-02 ENCOUNTER — Other Ambulatory Visit: Payer: Self-pay

## 2019-12-02 ENCOUNTER — Emergency Department
Admission: EM | Admit: 2019-12-02 | Discharge: 2019-12-02 | Disposition: A | Discharge status: Home / Self Care | Payer: BLUE CROSS/BLUE SHIELD | Attending: Emergency Medicine | Admitting: Emergency Medicine

## 2019-12-02 ENCOUNTER — Encounter: Payer: Self-pay | Admitting: Emergency Medicine

## 2019-12-02 ENCOUNTER — Telehealth: Payer: Self-pay

## 2019-12-02 ENCOUNTER — Telehealth: Payer: Self-pay | Admitting: Family

## 2019-12-02 DIAGNOSIS — J45909 Unspecified asthma, uncomplicated: Secondary | ICD-10-CM | POA: Insufficient documentation

## 2019-12-02 DIAGNOSIS — Z79899 Other long term (current) drug therapy: Secondary | ICD-10-CM | POA: Diagnosis not present

## 2019-12-02 DIAGNOSIS — R4589 Other symptoms and signs involving emotional state: Secondary | ICD-10-CM

## 2019-12-02 DIAGNOSIS — F329 Major depressive disorder, single episode, unspecified: Secondary | ICD-10-CM | POA: Diagnosis present

## 2019-12-02 LAB — URINE DRUG SCREEN, QUALITATIVE (ARMC ONLY)
Amphetamines, Ur Screen: NOT DETECTED
Barbiturates, Ur Screen: NOT DETECTED
Benzodiazepine, Ur Scrn: NOT DETECTED
Cannabinoid 50 Ng, Ur ~~LOC~~: NOT DETECTED
Cocaine Metabolite,Ur ~~LOC~~: NOT DETECTED
MDMA (Ecstasy)Ur Screen: NOT DETECTED
Methadone Scn, Ur: NOT DETECTED
Opiate, Ur Screen: NOT DETECTED
Phencyclidine (PCP) Ur S: NOT DETECTED
Tricyclic, Ur Screen: NOT DETECTED

## 2019-12-02 LAB — CBC
HCT: 38 % (ref 36.0–46.0)
Hemoglobin: 12.2 g/dL (ref 12.0–15.0)
MCH: 21.4 pg — ABNORMAL LOW (ref 26.0–34.0)
MCHC: 32.1 g/dL (ref 30.0–36.0)
MCV: 66.5 fL — ABNORMAL LOW (ref 80.0–100.0)
Platelets: 232 10*3/uL (ref 150–400)
RBC: 5.71 MIL/uL — ABNORMAL HIGH (ref 3.87–5.11)
RDW: 15.6 % — ABNORMAL HIGH (ref 11.5–15.5)
WBC: 5.8 10*3/uL (ref 4.0–10.5)
nRBC: 0 % (ref 0.0–0.2)

## 2019-12-02 LAB — COMPREHENSIVE METABOLIC PANEL
ALT: 24 U/L (ref 0–44)
AST: 23 U/L (ref 15–41)
Albumin: 4.3 g/dL (ref 3.5–5.0)
Alkaline Phosphatase: 46 U/L (ref 38–126)
Anion gap: 12 (ref 5–15)
BUN: 8 mg/dL (ref 6–20)
CO2: 26 mmol/L (ref 22–32)
Calcium: 9.2 mg/dL (ref 8.9–10.3)
Chloride: 99 mmol/L (ref 98–111)
Creatinine, Ser: 0.54 mg/dL (ref 0.44–1.00)
GFR calc Af Amer: >60 mL/min (ref >60)
GFR calc non Af Amer: >60 mL/min (ref >60)
Glucose, Bld: 218 mg/dL — ABNORMAL HIGH (ref 70–99)
Potassium: 3.3 mmol/L — ABNORMAL LOW (ref 3.5–5.1)
Sodium: 137 mmol/L (ref 135–145)
Total Bilirubin: 0.8 mg/dL (ref 0.3–1.2)
Total Protein: 7.6 g/dL (ref 6.5–8.1)

## 2019-12-02 LAB — ACETAMINOPHEN LEVEL: Acetaminophen (Tylenol), Serum: <10 ug/mL — ABNORMAL LOW (ref 10–30)

## 2019-12-02 LAB — ETHANOL: Alcohol, Ethyl (B): <10 mg/dL (ref <10)

## 2019-12-02 LAB — SALICYLATE LEVEL: Salicylate Lvl: <7 mg/dL — ABNORMAL LOW (ref 7.0–30.0)

## 2019-12-02 LAB — POCT PREGNANCY, URINE: Preg Test, Ur: NEGATIVE

## 2019-12-02 NOTE — ED Triage Notes (Signed)
Pt has refused [employer] interpreter in triage and is using her translator on her phone; says she did not try to hurt herself last night but did "kind of" have thoughts of hurting herself; pt says she is sad and does not want to be alone; pt says she had an episode yesterday of being very angry, now "just sad"; pt denies alcohol or drug use; pt calm and cooperative in triage

## 2019-12-02 NOTE — ED Notes (Signed)
Patient in room visiting with her husband at this time.

## 2019-12-02 NOTE — Telephone Encounter (Signed)
Stayed on phone with patient until she arrived at ER spoke to triage nurse and was advised ok to hang up that patient was being cared for.

## 2019-12-02 NOTE — Telephone Encounter (Signed)
Pt called and states that she needs to see her Dr "now, now, now". She states that she is outside of the office but then stated that she is outside of the hospital. Pt states that she has thoughts that will not stop for a couple of days now. Pt states that her and her husband are packing and needs to be seen. I transferred to [facility]

## 2019-12-02 NOTE — Discharge Instructions (Addendum)
Patient sugar was elevated today and that she can follow-up with her primary care doctors to make sure she does not have diabetes.  She declined psychiatric evaluation at this time.  Please return to the ER if you feel like your depression or sadness is getting worse.

## 2019-12-02 NOTE — ED Provider Notes (Signed)
[facility] Emergency Department Provider Note  ____________________________________________   First MD Initiated Contact with Patient 12/02/19 1253     (approximate)  I have reviewed the triage vital signs and the nursing notes.   HISTORY  Chief Complaint Depression    HPI Marissa Walsh is a 39 y.o. female who comes in for concerns for depression.  Patient states that she is sad does not want to be alone no alcohol or drug use per triage note.  I used a an Probation officer to discuss further with her.  Patient states that she was feeling sad earlier she thinks secondary to her menstruation versus being on a diet.  At this time she denies feeling sad.  She denies any SI or self-harm intents.  She states that she is never had any psychiatric illnesses before never required inpatient admission.  She is requesting to go home at this time.  Depression is intermittent, worse secondary to her diet and menstruation, unclear what made it better, mild.  Denies any self-harm.          Past Medical History:  Diagnosis Date  . Asthma   . Bacterial vaginosis   . Ovarian cyst   . Vulvar dermatitis     Patient Active Problem List   Diagnosis Date Noted  . Flu-like symptoms 11/02/2018  . Vitamin D deficiency 10/25/2017  . Cyst of left ovary 04/08/2017  . Mild single current episode of major depressive disorder (HCC) 10/25/2016  . Anemia 07/12/2016  . Routine general medical examination at a health care facility 05/08/2016  . Vaginal itching 05/08/2016    History reviewed. No pertinent surgical history.  Prior to Admission medications   Medication Sig Start Date End Date Taking? Authorizing Provider  clotrimazole (LOTRIMIN) 1 % cream Apply 1 application topically 2 (two) times daily. To outside skin 10/25/17   McLean-Scocuzza, Pasty Spillers, MD  clotrimazole-betamethasone (LOTRISONE) cream APPLY TO AFFECTED AREA TWICE A DAY. DO NOT APPLY IN VAGINA, ONLY ON  EXTERNAL GROIN AREA THAT IS AFFECTED 06/23/19   Guse, Janna Arch, FNP  fexofenadine (ALLEGRA ALLERGY) 180 MG tablet Take 1 tablet (180 mg total) by mouth daily. 08/19/18   Tracey Harries, FNP  hydrocortisone 2.5 % cream Apply topically 2 (two) times daily. To outside skin 10/25/17   McLean-Scocuzza, Pasty Spillers, MD  hydrOXYzine (ATARAX/VISTARIL) 25 MG tablet Take 1 tablet (25 mg total) by mouth 3 (three) times daily as needed for itching. 10/25/17   McLean-Scocuzza, Pasty Spillers, MD  ibuprofen (ADVIL,MOTRIN) 600 MG tablet Take 1 tablet (600 mg total) by mouth every 8 (eight) hours as needed. 10/31/18   Joaquim Nam, MD  oseltamivir (TAMIFLU) 75 MG capsule Take 1 capsule (75 mg total) by mouth 2 (two) times daily. 10/31/18   Joaquim Nam, MD  PROAIR HFA [phone] Base) MCG/ACT inhaler INHALE 1-2 PUFFS INTO THE LUNGS EVERY 6 HOURS AS NEEDED. 11/19/18   Allegra Grana, FNP  Vitamin D, Ergocalciferol, (DRISDOL) 1.25 MG (50000 UT) CAPS capsule TAKE 1 CAPSULE BY MOUTH EVERY 7 DAYS 02/06/19   Guse, Janna Arch, FNP    Allergies Shellfish allergy and Alcohol-sulfur [sulfur]  Family History  Problem Relation Age of Onset  . Hyperlipidemia Mother   . Hypertension Mother   . Diabetes Mother   . Cancer Father 30       colon cancer    Social History Social History   Tobacco Use  . Smoking status: Never Smoker  .  Smokeless tobacco: Never Used  Substance Use Topics  . Alcohol use: No  . Drug use: No      Review of Systems Constitutional: No fever/chills Eyes: No visual changes. ENT: No sore throat. Cardiovascular: Denies chest pain. Respiratory: Denies shortness of breath. Gastrointestinal: No abdominal pain.  No nausea, no vomiting.  No diarrhea.  No constipation. Genitourinary: Negative for dysuria. Musculoskeletal: Negative for back pain. Skin: Negative for rash. Neurological: Negative for headaches, focal weakness or numbness. Psych: Positive depression and sadness All other ROS  negative ____________________________________________   PHYSICAL EXAM:  VITAL SIGNS: ED Triage Vitals  Enc Vitals Group     BP 12/02/19 1132 (!) 143/93     Pulse Rate 12/02/19 1132 85     Resp 12/02/19 1132 18     Temp 12/02/19 1132 98.5 F (36.9 C)     Temp Source 12/02/19 1132 Oral     SpO2 12/02/19 1132 98 %     Weight 12/02/19 1134 139 lb (63 kg)     Height 12/02/19 1134 5\' 2"  (1.575 m)     Head Circumference --      Peak Flow --      Pain Score 12/02/19 1134 0     Pain Loc --      Pain Edu? --      Excl. in [place]? --     Constitutional: Alert and oriented. Well appearing and in no acute distress. Eyes: Conjunctivae are normal. EOMI. Head: Atraumatic. Nose: No congestion/rhinnorhea. Mouth/Throat: Mucous membranes are moist.   Neck: No stridor. Trachea Midline. FROM Cardiovascular: Normal rate, regular rhythm. Grossly normal heart sounds.  Good peripheral circulation. Respiratory: Normal respiratory effort.  No retractions. Lungs CTAB. Gastrointestinal: Soft and nontender. No distention. No abdominal bruits.  Musculoskeletal: No lower extremity tenderness nor edema.  No joint effusions. Neurologic:  Normal speech and language. No gross focal neurologic deficits are appreciated.  Skin:  Skin is warm, dry and intact. No rash noted. Psychiatric: Mood and affect are normal. Speech and behavior are normal.  Denying SI, HI or self-harm attempt GU: Deferred   ____________________________________________   LABS (all labs ordered are listed, but only abnormal results are displayed)  Labs Reviewed  COMPREHENSIVE METABOLIC PANEL - Abnormal; Notable for the following components:      Result Value   Potassium 3.3 (*)    Glucose, Bld 218 (*)    All other components within normal limits  SALICYLATE LEVEL - Abnormal; Notable for the following components:   Salicylate Lvl <2.7 (*)    All other components within normal limits  ACETAMINOPHEN LEVEL - Abnormal; Notable for the  following components:   Acetaminophen (Tylenol), Serum <10 (*)    All other components within normal limits  CBC - Abnormal; Notable for the following components:   RBC 5.71 (*)    MCV 66.5 (*)    MCH 21.4 (*)    RDW 15.6 (*)    All other components within normal limits  ETHANOL  URINE DRUG SCREEN, QUALITATIVE ([facility] ONLY)  POCT PREGNANCY, URINE  POC URINE PREG, ED   ____________________________________________    INITIAL IMPRESSION / ASSESSMENT AND PLAN / ED COURSE  Marissa Walsh was evaluated in Emergency Department on 12/02/2019 for the symptoms described in the history of present illness. She was evaluated in the context of the global COVID-19 pandemic, which necessitated consideration that the patient might be at risk for infection with the SARS-CoV-2 virus that causes COVID-19. Institutional protocols and algorithms that pertain  to the evaluation of patients at risk for COVID-19 are in a state of rapid change based on information released by regulatory bodies including the CDC and federal and state organizations. These policies and algorithms were followed during the patient's care in the ED.    Pt is without any acute medical complaints. No exam findings to suggest medical cause of current presentation. Will order psychiatric screening labs and discuss further w/ psychiatric service.  D/d includes but is not limited to psychiatric disease, behavioral/personality disorder, inadequate socioeconomic support, medical.  Based on HPI, exam, unremarkable labs, no concern for acute medical problem at this time. No rigidity, clonus, hyperthermia, focal neurologic deficit, diaphoresis, tachycardia, meningismus, ataxia, gait abnormality or other finding to suggest this visit represents a non-psychiatric problem. Screening labs reviewed.    Given this, pt medically cleared, to be dispositioned per Psych.  Patient now stating that she does not want to stay for psychiatric evaluation.  I had a lengthy  discussion with the Mandarin interpreter and that she does not seem to be a threat to herself or to others.  She does not appear psychotic or manic at this time.  At this time she does not meet IVC criteria.  We will see if the husband can come by in order to get some collateral information just to make sure that he feels that she is safe to go home.  I did talk with the mandrin interpreter with the husband at bedside who denied that she made any self-harm attempts or had concerns for her trying to hurt herself.  He felt comfortable with her going on and felt that she did not need emergent IVC.  Given this we will allow patient to be discharged and she can follow-up with her primary care doctor.  I did discuss with patient that her sugar was slightly elevated and this needs to be followed up with her primary care doctor ____________________________________________   FINAL CLINICAL IMPRESSION(S) / ED DIAGNOSES   Final diagnoses:  Feeling of sadness      MEDICATIONS GIVEN DURING THIS VISIT:  Medications - No data to display   ED Discharge Orders    None       Note:  This document was prepared using Dragon voice recognition software and may include unintentional dictation errors.   Concha Se, MD 12/03/19 [phone]

## 2019-12-02 NOTE — Telephone Encounter (Signed)
She is at [facility]

## 2019-12-02 NOTE — BH Assessment (Signed)
Writer unable to complete TTS Consult, due to patient discharging prior to Clinical research associate seeing them.

## 2019-12-02 NOTE — Telephone Encounter (Signed)
Have spoken to patient. Some language barrier and patient is speaking fast making her to understand.  States she tried to hurt herself last night, will not give details.  'Cannot stop thinking.'  She states she is alone  Driving a [place]  Not in danger at home.  On a diet trying to loose weight.  Starts to ramble.  Spoke with and confirmed with [facility] nurse triage that patinent is in the emergency room and with a nurse right now being triaged

## 2019-12-02 NOTE — Telephone Encounter (Signed)
Spoken to patient, she stated her mind is racing and has anxiety and constant worrying. She feels like she is going crazy. She keeps stating she only wants "to see Claris Che NOW" repeatedly. I offered another appointment, ED, and UC. She refused and stated she wants to see her PCP now. She stated she is packing and ever since she started her period her mind is going crazy and cant stop thinking/worrying, pt wants medication to make it stop. She states she has no intent of hurting herself nor anyone else. Tried speaking with patient again and she stated she needs to see Claris Che Now, Please advise.

## 2019-12-02 NOTE — ED Triage Notes (Signed)
Care provider from [place] reports pt called them and was erratic and rambling and attempted to hurt herseff last pm.

## 2019-12-02 NOTE — Telephone Encounter (Signed)
Error

## 2019-12-28 ENCOUNTER — Telehealth: Payer: Self-pay | Admitting: Family

## 2019-12-28 NOTE — Telephone Encounter (Signed)
I spoke with patient who seems unsure about appointment with interpreter. She wanted to cancel, but I stressed the importance of being seen she hasn't in so long. Patient reluctantly agreed to virtual visit & I assured her that she was did not have to worry about privacy of call. I told her to please look out for text message link I will sent her shortly before appointment time on Wednesday.

## 2019-12-28 NOTE — Telephone Encounter (Signed)
noted 

## 2019-12-28 NOTE — Telephone Encounter (Signed)
Let me know on Wednesday if I can help.

## 2019-12-28 NOTE — Telephone Encounter (Signed)
I know this patient definitely needs to be seen. What are your thought?

## 2019-12-28 NOTE — Telephone Encounter (Signed)
Pt has an appt on 12/30/19 with Claris Che. I called her to do the screening questions and pt said she is sneezing with a little bit of a running nose. I let her know that her appt would have to be virtual due to her having a running nose and she said she don't think she will be able to do a virtual and wasn't sure what a virtual was. I explained how we do virtual visit and she started asking me about getting her covid vaccine if she has allergies. I told her I wasn't clinical and that I would need to ask. Pt agreed finally to do virtual but she is afraid she might not be able to understand without interpreter. Please advise.

## 2019-12-28 NOTE — Telephone Encounter (Signed)
I was told we could have interpretors on caregility..  Though no idea how to do this.   Can we arrange interpretor?

## 2019-12-30 ENCOUNTER — Encounter: Payer: BLUE CROSS/BLUE SHIELD | Admitting: Family

## 2019-12-30 NOTE — Progress Notes (Signed)
This encounter was created in error - please disregard.

## 2019-12-30 NOTE — Progress Notes (Signed)
We were able to briefly connect with patient today with interpreter present on virtual visit. Patient would not turn on camera stating that she did not want to. She said that she wanted to let us know that she was okay & she did not wish to proceed with visit. I advised against this since patient was seen in ED earlier this month. Patient stated again that she was okay & did not wish to keep talking about that. I ended visit without patient seeing you as she requested.   ----   patient was not seen and she canceled the video with Emmaline Kluver, CMA per patient request above.  Rennie Plowman, NP

## 2020-01-12 ENCOUNTER — Emergency Department: Payer: BLUE CROSS/BLUE SHIELD

## 2020-01-12 ENCOUNTER — Inpatient Hospital Stay
Admission: EM | Admit: 2020-01-12 | Discharge: 2020-01-16 | DRG: 071 | Disposition: A | Discharge status: Home / Self Care | Payer: BLUE CROSS/BLUE SHIELD | Attending: Internal Medicine | Admitting: Internal Medicine

## 2020-01-12 ENCOUNTER — Other Ambulatory Visit: Payer: Self-pay

## 2020-01-12 ENCOUNTER — Encounter: Payer: Self-pay | Admitting: Emergency Medicine

## 2020-01-12 DIAGNOSIS — Z20822 Contact with and (suspected) exposure to covid-19: Secondary | ICD-10-CM | POA: Diagnosis present

## 2020-01-12 DIAGNOSIS — R410 Disorientation, unspecified: Secondary | ICD-10-CM | POA: Diagnosis present

## 2020-01-12 DIAGNOSIS — D509 Iron deficiency anemia, unspecified: Secondary | ICD-10-CM | POA: Diagnosis present

## 2020-01-12 DIAGNOSIS — E872 Acidosis: Secondary | ICD-10-CM | POA: Diagnosis present

## 2020-01-12 DIAGNOSIS — R739 Hyperglycemia, unspecified: Secondary | ICD-10-CM | POA: Diagnosis present

## 2020-01-12 DIAGNOSIS — Z800 Family history of malignant neoplasm of digestive organs: Secondary | ICD-10-CM

## 2020-01-12 DIAGNOSIS — G9341 Metabolic encephalopathy: Secondary | ICD-10-CM | POA: Diagnosis not present

## 2020-01-12 DIAGNOSIS — Z72820 Sleep deprivation: Secondary | ICD-10-CM

## 2020-01-12 DIAGNOSIS — Z833 Family history of diabetes mellitus: Secondary | ICD-10-CM

## 2020-01-12 DIAGNOSIS — D72828 Other elevated white blood cell count: Secondary | ICD-10-CM

## 2020-01-12 DIAGNOSIS — D569 Thalassemia, unspecified: Secondary | ICD-10-CM | POA: Diagnosis present

## 2020-01-12 DIAGNOSIS — Z91013 Allergy to seafood: Secondary | ICD-10-CM

## 2020-01-12 DIAGNOSIS — Z8249 Family history of ischemic heart disease and other diseases of the circulatory system: Secondary | ICD-10-CM

## 2020-01-12 DIAGNOSIS — Z781 Physical restraint status: Secondary | ICD-10-CM

## 2020-01-12 DIAGNOSIS — R4689 Other symptoms and signs involving appearance and behavior: Secondary | ICD-10-CM

## 2020-01-12 DIAGNOSIS — J45909 Unspecified asthma, uncomplicated: Secondary | ICD-10-CM | POA: Diagnosis present

## 2020-01-12 DIAGNOSIS — Z83438 Family history of other disorder of lipoprotein metabolism and other lipidemia: Secondary | ICD-10-CM

## 2020-01-12 DIAGNOSIS — E559 Vitamin D deficiency, unspecified: Secondary | ICD-10-CM | POA: Diagnosis present

## 2020-01-12 DIAGNOSIS — D72829 Elevated white blood cell count, unspecified: Secondary | ICD-10-CM | POA: Diagnosis present

## 2020-01-12 HISTORY — DX: Thalassemia, unspecified: D56.9

## 2020-01-12 LAB — COMPREHENSIVE METABOLIC PANEL
ALT: 20 U/L (ref 0–44)
AST: 21 U/L (ref 15–41)
Albumin: 4.4 g/dL (ref 3.5–5.0)
Alkaline Phosphatase: 50 U/L (ref 38–126)
Anion gap: 11 (ref 5–15)
BUN: 14 mg/dL (ref 6–20)
CO2: 26 mmol/L (ref 22–32)
Calcium: 8.9 mg/dL (ref 8.9–10.3)
Chloride: 103 mmol/L (ref 98–111)
Creatinine, Ser: 0.5 mg/dL (ref 0.44–1.00)
GFR calc Af Amer: >60 mL/min (ref >60)
GFR calc non Af Amer: >60 mL/min (ref >60)
Glucose, Bld: 109 mg/dL — ABNORMAL HIGH (ref 70–99)
Potassium: 3.5 mmol/L (ref 3.5–5.1)
Sodium: 140 mmol/L (ref 135–145)
Total Bilirubin: 0.8 mg/dL (ref 0.3–1.2)
Total Protein: 8 g/dL (ref 6.5–8.1)

## 2020-01-12 LAB — CSF CELL COUNT WITH DIFFERENTIAL
Eosinophils, CSF: 0 %
Eosinophils, CSF: 0 %
Lymphs, CSF: 0 %
Lymphs, CSF: 0 %
Monocyte-Macrophage-Spinal Fluid: 0 %
Monocyte-Macrophage-Spinal Fluid: 0 %
RBC Count, CSF: 0 /mm3 (ref 0–3)
RBC Count, CSF: 9 /mm3 — ABNORMAL HIGH (ref 0–3)
Segmented Neutrophils-CSF: 0 %
Segmented Neutrophils-CSF: 0 %
Tube #: 1
Tube #: 4
WBC, CSF: 0 /mm3 (ref 0–5)
WBC, CSF: 0 /mm3 (ref 0–5)

## 2020-01-12 LAB — PROTEIN, CSF: Total  Protein, CSF: 27 mg/dL (ref 15–45)

## 2020-01-12 LAB — URINE DRUG SCREEN, QUALITATIVE (ARMC ONLY)
Amphetamines, Ur Screen: NOT DETECTED
Barbiturates, Ur Screen: NOT DETECTED
Benzodiazepine, Ur Scrn: NOT DETECTED
Cannabinoid 50 Ng, Ur ~~LOC~~: NOT DETECTED
Cocaine Metabolite,Ur ~~LOC~~: NOT DETECTED
MDMA (Ecstasy)Ur Screen: NOT DETECTED
Methadone Scn, Ur: NOT DETECTED
Opiate, Ur Screen: NOT DETECTED
Phencyclidine (PCP) Ur S: NOT DETECTED
Tricyclic, Ur Screen: NOT DETECTED

## 2020-01-12 LAB — URINALYSIS, COMPLETE (UACMP) WITH MICROSCOPIC
Bacteria, UA: NONE SEEN
Bilirubin Urine: NEGATIVE
Glucose, UA: NEGATIVE mg/dL
Hgb urine dipstick: NEGATIVE
Ketones, ur: 20 mg/dL — AB
Leukocytes,Ua: NEGATIVE
Nitrite: NEGATIVE
Protein, ur: NEGATIVE mg/dL
Specific Gravity, Urine: 1.021 (ref 1.005–1.030)
pH: 6 (ref 5.0–8.0)

## 2020-01-12 LAB — ACETAMINOPHEN LEVEL: Acetaminophen (Tylenol), Serum: <10 ug/mL — ABNORMAL LOW (ref 10–30)

## 2020-01-12 LAB — LACTIC ACID, PLASMA
Lactic Acid, Venous: 4.1 mmol/L (ref 0.5–1.9)
Lactic Acid, Venous: 1.1 mmol/L (ref 0.5–1.9)

## 2020-01-12 LAB — CBC
HCT: 38 % (ref 36.0–46.0)
Hemoglobin: 12.5 g/dL (ref 12.0–15.0)
MCH: 21.5 pg — ABNORMAL LOW (ref 26.0–34.0)
MCHC: 32.9 g/dL (ref 30.0–36.0)
MCV: 65.4 fL — ABNORMAL LOW (ref 80.0–100.0)
Platelets: 273 10*3/uL (ref 150–400)
RBC: 5.81 MIL/uL — ABNORMAL HIGH (ref 3.87–5.11)
RDW: 15.9 % — ABNORMAL HIGH (ref 11.5–15.5)
WBC: 10.6 10*3/uL — ABNORMAL HIGH (ref 4.0–10.5)
nRBC: 0 % (ref 0.0–0.2)

## 2020-01-12 LAB — GLUCOSE, CSF: Glucose, CSF: 73 mg/dL — ABNORMAL HIGH (ref 40–70)

## 2020-01-12 LAB — ETHANOL: Alcohol, Ethyl (B): <10 mg/dL (ref <10)

## 2020-01-12 LAB — TSH: TSH: 1.019 u[IU]/mL (ref 0.350–4.500)

## 2020-01-12 LAB — IRON AND TIBC
Iron: 70 ug/dL (ref 28–170)
Saturation Ratios: 17 % (ref 10.4–31.8)
TIBC: 420 ug/dL (ref 250–450)
UIBC: 350 ug/dL

## 2020-01-12 LAB — T4, FREE: Free T4: 1.12 ng/dL (ref 0.61–1.12)

## 2020-01-12 LAB — RESPIRATORY PANEL BY RT PCR (FLU A&B, COVID)
Influenza A by PCR: NEGATIVE
Influenza B by PCR: NEGATIVE
SARS Coronavirus 2 by RT PCR: NEGATIVE

## 2020-01-12 LAB — SALICYLATE LEVEL: Salicylate Lvl: <7 mg/dL — ABNORMAL LOW (ref 7.0–30.0)

## 2020-01-12 MED ORDER — LACTATED RINGERS IV BOLUS (SEPSIS)
1000.0000 mL | Freq: Once | INTRAVENOUS | Status: AC
  Administered 2020-01-12: 1000 mL via INTRAVENOUS

## 2020-01-12 MED ORDER — HALOPERIDOL LACTATE 5 MG/ML IJ SOLN: 5.0000 mg | Freq: Once | INTRAMUSCULAR | Status: AC

## 2020-01-12 MED ORDER — DEXAMETHASONE SODIUM PHOSPHATE 10 MG/ML IJ SOLN
10.0000 mg | Freq: Once | INTRAMUSCULAR | Status: AC
  Administered 2020-01-12: 10 mg via INTRAVENOUS
  Filled 2020-01-12: qty 1

## 2020-01-12 MED ORDER — HALOPERIDOL LACTATE 5 MG/ML IJ SOLN
INTRAMUSCULAR | Status: AC
  Administered 2020-01-12: 5 mg via INTRAMUSCULAR
  Filled 2020-01-12: qty 1

## 2020-01-12 MED ORDER — HALOPERIDOL LACTATE 5 MG/ML IJ SOLN
5.0000 mg | Freq: Once | INTRAMUSCULAR | Status: AC
  Administered 2020-01-12: 5 mg via INTRAMUSCULAR

## 2020-01-12 MED ORDER — SODIUM CHLORIDE 0.9% FLUSH
3.0000 mL | Freq: Two times a day (BID) | INTRAVENOUS | Status: DC
  Administered 2020-01-13 – 2020-01-16 (×6): 3 mL via INTRAVENOUS

## 2020-01-12 MED ORDER — FENTANYL CITRATE (PF) 100 MCG/2ML IJ SOLN
100.0000 ug | Freq: Once | INTRAMUSCULAR | Status: AC
  Administered 2020-01-12: 100 ug via INTRAVENOUS

## 2020-01-12 MED ORDER — TRAMADOL HCL 50 MG PO TABS
50.0000 mg | ORAL_TABLET | Freq: Three times a day (TID) | ORAL | Status: DC | PRN
  Administered 2020-01-15: 23:00:00 50 mg via ORAL
  Filled 2020-01-12: qty 1

## 2020-01-12 MED ORDER — LORAZEPAM 2 MG/ML IJ SOLN
INTRAMUSCULAR | Status: AC
  Filled 2020-01-12: qty 1

## 2020-01-12 MED ORDER — FENTANYL CITRATE (PF) 100 MCG/2ML IJ SOLN
INTRAMUSCULAR | Status: AC
  Filled 2020-01-12: qty 4

## 2020-01-12 MED ORDER — LORAZEPAM 2 MG/ML IJ SOLN
2.0000 mg | Freq: Once | INTRAMUSCULAR | Status: AC
  Administered 2020-01-12: 2 mg via INTRAMUSCULAR

## 2020-01-12 MED ORDER — MIDAZOLAM HCL 5 MG/5ML IJ SOLN: 4.0000 mg | Freq: Once | INTRAMUSCULAR | Status: AC

## 2020-01-12 MED ORDER — ENOXAPARIN SODIUM 40 MG/0.4ML ~~LOC~~ SOLN: 40.0000 mg | SUBCUTANEOUS | Status: DC

## 2020-01-12 MED ORDER — ACETAMINOPHEN 650 MG RE SUPP: 650.0000 mg | RECTAL | Status: DC | PRN

## 2020-01-12 MED ORDER — DEXTROSE 5 % IV SOLN
10.0000 mg/kg | Freq: Once | INTRAVENOUS | Status: AC
  Administered 2020-01-12: 630 mg via INTRAVENOUS
  Filled 2020-01-12: qty 12.6

## 2020-01-12 MED ORDER — VANCOMYCIN HCL IN DEXTROSE 1-5 GM/200ML-% IV SOLN
1000.0000 mg | Freq: Once | INTRAVENOUS | Status: AC
  Administered 2020-01-12: 1000 mg via INTRAVENOUS
  Filled 2020-01-12: qty 200

## 2020-01-12 MED ORDER — ONDANSETRON HCL 4 MG/2ML IJ SOLN: 4.0000 mg | Freq: Four times a day (QID) | INTRAMUSCULAR | Status: DC | PRN

## 2020-01-12 MED ORDER — SODIUM CHLORIDE 0.9 % IV SOLN: INTRAVENOUS | Status: DC

## 2020-01-12 MED ORDER — BISACODYL 5 MG PO TBEC: 5.0000 mg | DELAYED_RELEASE_TABLET | Freq: Every day | ORAL | Status: DC | PRN

## 2020-01-12 MED ORDER — ONDANSETRON HCL 4 MG PO TABS: 4.0000 mg | ORAL_TABLET | Freq: Four times a day (QID) | ORAL | Status: DC | PRN

## 2020-01-12 MED ORDER — HALOPERIDOL LACTATE 5 MG/ML IJ SOLN
INTRAMUSCULAR | Status: AC
  Filled 2020-01-12: qty 1

## 2020-01-12 MED ORDER — MIDAZOLAM HCL 2 MG/2ML IJ SOLN
INTRAMUSCULAR | Status: AC
  Administered 2020-01-12: 4 mg via INTRAVENOUS
  Filled 2020-01-12: qty 6

## 2020-01-12 MED ORDER — LORAZEPAM 0.5 MG PO TABS
0.5000 mg | ORAL_TABLET | Freq: Once | ORAL | Status: DC
  Filled 2020-01-12: qty 1

## 2020-01-12 MED ORDER — SODIUM CHLORIDE 0.9 % IV SOLN
2.0000 g | Freq: Once | INTRAVENOUS | Status: AC
  Administered 2020-01-12: 2 g via INTRAVENOUS
  Filled 2020-01-12: qty 20

## 2020-01-12 NOTE — ED Notes (Addendum)
Was very difficult to assess pt even with interpretor [id number] mandrin interpretor.  Pt understood language but just kept repeating what was said. She would not answer questions. Husband brought to room with interpretor; he does speak some english but was better understood with interpretor.  He reports that pt has not been sleeping for last 2 days and has been confused.  Denies her hearing voices or speaking to people not there.  Denies hx of same behavior.  Pt tearful. He denies her making suicidal or homicidal remarks.

## 2020-01-12 NOTE — Progress Notes (Signed)
PHARMACY -  BRIEF ANTIBIOTIC/ANTIVIRAL NOTE   Pharmacy has received consult(s) for Vancomycin and Acyclovir from an ED provider.  The patient's profile has been reviewed for ht/wt/allergies/indication/available labs.    One time order(s) placed for Vancomycin 1g IV and Acyclovir 630 mg(10mg /kd dosing)  x 1 dose.  Further antibiotics/pharmacy consults should be ordered by admitting physician if indicated.                       Thank you, FREIDA NEBEL 01/12/2020  5:52 PM

## 2020-01-12 NOTE — ED Notes (Signed)
IVC/Consult ordered/RN & Security aware of Legal status 

## 2020-01-12 NOTE — ED Notes (Signed)
Pt intermittently screaming out.

## 2020-01-12 NOTE — ED Notes (Signed)
Pt woke asking to use restroom.  Bedpan placed under patient and pt urinated a substantial amount.  Purewick in place.  Pt beginning to get agitated again.  Wrist restraints still in place.  + Radial pulses with skin warm and dry and color WNL.   Pt will grab my hand and dig her nails into me. Stating "I hit and kill you".  Ankle restraints remain loosened but in place as precaution.  Will continue to monitor,  Pt refuses to keep pulse ox on.  Will replace when she falls back to sleep.

## 2020-01-12 NOTE — ED Notes (Signed)
Patient is resting comfortably. 4-point restraints still in place. Bilateral wrist restraints still in place. Bilateral ankle restraints remain loosened but in place as precaution.  + Radial pulses w/ skin warm and dry and color WNL. No other needs identified. This tech remains 1:1.

## 2020-01-12 NOTE — Progress Notes (Signed)
[facility] @ ED RN station charting and heard pt.'s crying in hallway; pt. in bed in hallway, husband kneeling beside bed holding pt.'s hand.  Pt. very agitated and frightened; Pt. speaking in [place] to husband.  CH asked pt./husband if a prayer might help --> husband thought it might.  CH began to pray for pt. but pt. became more and more agitated, finally crying, 'I don't know what you're saying!' in [place].  CH attempted to calmly introduce himself and assess what pt. might need, but pt. too anxious to communicate.  Husband said she keeps saying 'I'm scared' over and over.  Pt. begged her husband not to leave; intermittently cried out, 'I'm [place]!  I'm not American!' Pt. became more and more anxious; unwilling to take anxiety meds via mouth; medication administered via shot.  Pt. brought from hallway into rm. once space opened up.  Ministry to pt./assessment of situation made difficult by language and potential cultural barrier b/w CH and pt.  Will recommend follow-up if possible.    01/12/20 1300  Clinical Encounter Type  Visited With Patient and family together  Visit Type Initial;Psychological support;Spiritual support;Social support;ED  Spiritual Encounters  Spiritual Needs Emotional (Pt. very anxious, tearful, inconsolable)  Stress Factors  Patient Stress Factors Loss of control (Very frightened)

## 2020-01-12 NOTE — ED Notes (Signed)
[place] applied to pt.  Kept pulling monitor equipment off. Concern for pulling IV out and need monitor equipment to remain on pt.

## 2020-01-12 NOTE — ED Notes (Signed)
Pt in and out of sleep.  Wakes up, tugs at restraints, and goes back to sleep.  Restraints intact x4.  + radial and pedal pulses present.  Skin warm and color WNL.

## 2020-01-12 NOTE — ED Notes (Signed)
Psych consult at bedside.  [place]rin interpretor on screen for their assessment.

## 2020-01-12 NOTE — Sedation Documentation (Signed)
100mg  fentanyl administered by , RN. 4mg  versed administered by Helmut Muster, RN. Verbal order MD Siadecki

## 2020-01-12 NOTE — ED Notes (Signed)
Pt awake and becoming agitated.  Able to redirect for short amount of time.  Restraints x4 still in place.  Ankle restraints still loosened.  Pt kicking but limited range.  + pedal and radial pulses with skin warm and dry and color WNL.  Pt falls asleep easily but wakes quickly and agitated..  Will continue to monitor.

## 2020-01-12 NOTE — ED Provider Notes (Addendum)
[facility] Emergency Department Provider Note  ____________________________________________   None    (approximate)  I have reviewed the triage vital signs and the nursing notes.  History  Chief Complaint Not sleeping Acting abnormally    HPI Marissa Walsh is a 39 y.o. female past medical history as below, who presents to the emergency department with her husband for abnormal behavior. Husband states the patient has not slept in 2.5 days.  Patient was reportedly restless and agitated last night and acting abnormally today. He states she is not making suicidal statements, but is saying she is dying, and saying sorry. He denies any major recent life changes, aside from starting to talk about buying a house. Does not take any daily psychiatric medications, has a history of major depressive episode on 2018.   History obtained with assistance of Mandarin interpreter. First interviewed with patient alone, and then with husband (and interpreter) for supplementation.   History extremely limited as patient is a difficult historian. She seems confused and essentially just repeats back what the interpreter says to her in Mandarin. She asks repetitively, "who should I look at [facility] or interpreter]?" And then seems to become distressed and states "I can't see anything!" Then looks at her husband and says she can see her husband.   Husband says her behavior is abnormal. No history of same. Husband denies any alcohol or drug use.    Past Medical Hx Past Medical History:  Diagnosis Date  . Asthma   . Bacterial vaginosis   . Ovarian cyst   . Vulvar dermatitis     Problem List Patient Active Problem List   Diagnosis Date Noted  . Flu-like symptoms 11/02/2018  . Vitamin D deficiency 10/25/2017  . Cyst of left ovary 04/08/2017  . Mild single current episode of major depressive disorder (HCC) 10/25/2016  . Anemia 07/12/2016  . Routine general medical examination at  a health care facility 05/08/2016  . Vaginal itching 05/08/2016    Past Surgical Hx History reviewed. No pertinent surgical history.  Medications Prior to Admission medications   Medication Sig Start Date End Date Taking? Authorizing Provider  fexofenadine (ALLEGRA ALLERGY) 180 MG tablet Take 1 tablet (180 mg total) by mouth daily. 08/19/18   Tracey Harries, FNP  hydrOXYzine (ATARAX/VISTARIL) 25 MG tablet Take 1 tablet (25 mg total) by mouth 3 (three) times daily as needed for itching. 10/25/17   McLean-Scocuzza, Pasty Spillers, MD  ibuprofen (ADVIL,MOTRIN) 600 MG tablet Take 1 tablet (600 mg total) by mouth every 8 (eight) hours as needed. 10/31/18   Joaquim Nam, MD  PROAIR HFA 108 [phone] Base) MCG/ACT inhaler INHALE 1-2 PUFFS INTO THE LUNGS EVERY 6 HOURS AS NEEDED. 11/19/18   Allegra Grana, FNP    Allergies Shellfish allergy and Alcohol-sulfur [sulfur]  Family Hx Family History  Problem Relation Age of Onset  . Hyperlipidemia Mother   . Hypertension Mother   . Diabetes Mother   . Cancer Father 79       colon cancer    Social Hx Social History   Tobacco Use  . Smoking status: Never Smoker  . Smokeless tobacco: Never Used  Substance Use Topics  . Alcohol use: No  . Drug use: No     Review of Systems Unable to reliably obtain due to patient's clinical presentation.   Physical Exam  Vital Signs: ED Triage Vitals  Enc Vitals Group     BP 01/12/20 1054 125/88  Pulse Rate 01/12/20 1054 88     Resp 01/12/20 1054 16     Temp 01/12/20 1054 98.7 F (37.1 C)     Temp Source 01/12/20 1054 Oral     SpO2 01/12/20 1054 99 %     Weight 01/12/20 1055 138 lb 14.2 oz (63 kg)     Height 01/12/20 1055 5\' 2"  (1.575 m)     Head Circumference --      Peak Flow --      Pain Score 01/12/20 1055 0     Pain Loc --      Pain Edu? --      Excl. in GC? --     Constitutional: Awake and alert. Oriented to self. Knows who her husband is. Very difficult to obtain history, as noted  above. Head: Normocephalic. Atraumatic. Eyes: Conjunctivae clear. Sclera anicteric. Nose: No congestion. No rhinorrhea. Mouth/Throat: Mucous membranes are moist.  Neck: No stridor.   Cardiovascular: Normal rate. Extremities well perfused. Respiratory: Normal respiratory effort.  No hypoxia. No accessory muscle use.  Gastrointestinal: Non-distended.  Musculoskeletal: No deformities. Ambulatory w/ steady gait.  Neurologic:  No gross focal neurologic deficits are appreciated. Moves all extremities equally. Ambulatory with steady gait. No focal or lateralizing neurological deficits. Speech seems clear, no dysarthria. Intermittently screaming and agitated. Skin: Skin is warm, dry and intact.  Psychiatric: Seems confused, repeats almost all interpreter questions without answering them. Intermittently distressed/labile. Denies SI or HI.  EKG  N/A    Radiology  Personally reviewed imaging.   CT head  IMPRESSION:  Areas of paranasal sinus disease, primarily in the left sphenoid  sinus region. Study otherwise unremarkable.    Procedures  Procedure(s) performed (including critical care):  Procedures   Initial Impression / Assessment and Plan / ED Course  39 y.o. female who presents to the ED for abnormal behavior, as noted above.   Concern for depression w/ psychosis vs mania vs insomnia vs other/new psychiatric diagnosis. Also consider electrolyte abnormality, drug use (though husband denies), electrolyte abnormality, UTI, intracranial etiology. Will obtain basic screening labs, CT head imaging, urine studies, and consult psychiatry and TTS.     Clinical Course as of Jan 12 1627  Tue Jan 12, 2020  1141 Labs thus far reveal decreased MCV, consistent with baseline. Normal electrolytes.    [SM]  1227 CT head with sinus disease, otherwise unremarkable.   [SM]  1356 Patient became more erratic and aggressive and screaming. Unable to be verbally de-escalated. Will proceed with IVC  and administer dose of Ativan.   The patient has been placed in psychiatric observation due to the need to provide a safe environment for the patient while obtaining psychiatric consultation and evaluation, as well as ongoing medical and medication management to treat the patient's condition. The patient has been placed under full IVC at this time.  Awaiting psychiatry/TTS evaluation at this time. Ethanol, salicylate, acetaminophen negative.   [SM]  1539 Psychiatry has seen and evaluated. At this point, unsure if her presentation is 100% psychiatric in etiology. Recommends Neurology consult.    [SM]  1545 Discussed with Neurology. Will admit for further evaluation and neurology testing, likely to include MRI/EEG. Will discuss w/ hospitalist.    [SM]  1604 Discussed w/ hospitalist for admission. Patient handed off to oncoming provider due to shift change, plan for admission. RPR, COVID swab, thyroid studies added.   [SM]    Clinical Course User Index [SM] 1605., MD  Final Clinical Impression(s) / ED Diagnosis  Final diagnoses:  Abnormal behavior       Note:  This document was prepared using Dragon voice recognition software and may include unintentional dictation errors.       Lilia Pro., MD 01/12/20 [phone]

## 2020-01-12 NOTE — ED Notes (Signed)
Pt eating meal tray 

## 2020-01-12 NOTE — H&P (Signed)
History and Physical    Marissa Walsh HAF:[id number] DOB: [birth year] DOA: 01/12/2020  PCP: Allegra Grana, FNP  Patient coming from: home   Chief Complaint: confusion  HPI: 39 y/o F w/ PMH of depression, allergies, asthma who presents w/ confusion x night prior to admission. All of hx was obtain via pt's husband, EMR review and pt's ER nurse. Pt has been c/o a headache the past couple of days as per pt's husband. Pt had no had any recent falls or trauma. Initially when pt came to the ER, pt was awake, alert, not oriented but calm and was able to walk independently.  Pt evidently kept saying she was dying but couldn't say why. Of note, pt came to the ER on 12/02/19 for depression possibly secondary menstruation & diet as per ER physician's note. Pt was not admitted to the hospital at that time. When I arrived to the ER the pt was screaming at the top of her lungs, tachycardic and tachypneic laying on the bed. Pt would not answer any of my questions.   Review of Systems: As per HPI otherwise 10 point review of systems negative.    Past Medical History:  Diagnosis Date  . Asthma   . Bacterial vaginosis   . Ovarian cyst   . Vulvar dermatitis     History reviewed. No pertinent surgical history.   reports that she has never smoked. She has never used smokeless tobacco. She reports that she does not drink alcohol or use drugs.  Allergies  Allergen Reactions  . Shellfish Allergy Itching  . Alcohol-Sulfur [Sulfur] Itching    Family History  Problem Relation Age of Onset  . Hyperlipidemia Mother   . Hypertension Mother   . Diabetes Mother   . Cancer Father 56       colon cancer     Prior to Admission medications   Medication Sig Start Date End Date Taking? Authorizing Provider  fexofenadine (ALLEGRA ALLERGY) 180 MG tablet Take 1 tablet (180 mg total) by mouth daily. 08/19/18   Tracey Harries, FNP  hydrOXYzine (ATARAX/VISTARIL) 25 MG tablet Take 1 tablet (25 mg total) by mouth 3  (three) times daily as needed for itching. 10/25/17   McLean-Scocuzza, Pasty Spillers, MD  ibuprofen (ADVIL,MOTRIN) 600 MG tablet Take 1 tablet (600 mg total) by mouth every 8 (eight) hours as needed. 10/31/18   Joaquim Nam, MD  PROAIR HFA 108 [phone] Base) MCG/ACT inhaler INHALE 1-2 PUFFS INTO THE LUNGS EVERY 6 HOURS AS NEEDED. 11/19/18   Allegra Grana, FNP    Physical Exam: Vitals:   01/12/20 1054 01/12/20 1055  BP: 125/88   Pulse: 88   Resp: 16   Temp: 98.7 F (37.1 C)   TempSrc: Oral   SpO2: 99%   Weight:  63 kg  Height:  5\' 2"  (1.575 m)    Constitutional: NAD, calm, comfortable Vitals:   01/12/20 1054 01/12/20 1055  BP: 125/88   Pulse: 88   Resp: 16   Temp: 98.7 F (37.1 C)   TempSrc: Oral   SpO2: 99%   Weight:  63 kg  Height:  5\' 2"  (1.575 m)   Eyes:  lids and conjunctivae normal ENMT: Mucous membranes are moist.  Neck: normal, supple,  Respiratory: diminished breath sounds b/l otherwise clear. Increased RR   Cardiovascular: sinus tachycardia, no  gallops. No extremity edema.  Abdomen:  Soft, no tenderness, ND. Hypoactive bowel sounds positive.  Musculoskeletal: no clubbing / cyanosis. No joint deformity  upper and lower extremities. Normal muscle tone.  Skin: no apparent skin findings as difficult to assess as pt is screaming and wont let you move her   Neurologic: unable to assess as pt would not follow directions Psychiatric: Abnormal judgment and insight. Agitated. Intermittently screaming     Labs on Admission: I have personally reviewed following labs and imaging studies  CBC: Recent Labs  Lab 01/12/20 1058  WBC 10.6*  HGB 12.5  HCT 38.0  MCV 65.4*  PLT 273   Basic Metabolic Panel: Recent Labs  Lab 01/12/20 1058  NA 140  K 3.5  CL 103  CO2 26  GLUCOSE 109*  BUN 14  CREATININE 0.50  CALCIUM 8.9   GFR: Estimated Creatinine Clearance: 82.4 mL/min (by C-G formula based on SCr of 0.5 mg/dL). Liver Function Tests: Recent Labs  Lab  01/12/20 1058  AST 21  ALT 20  ALKPHOS 50  BILITOT 0.8  PROT 8.0  ALBUMIN 4.4   No results for input(s): LIPASE, AMYLASE in the last 168 hours. No results for input(s): AMMONIA in the last 168 hours. Coagulation Profile: No results for input(s): INR, PROTIME in the last 168 hours. Cardiac Enzymes: No results for input(s): CKTOTAL, CKMB, CKMBINDEX, TROPONINI in the last 168 hours. BNP (last 3 results) No results for input(s): PROBNP in the last 8760 hours. HbA1C: No results for input(s): HGBA1C in the last 72 hours. CBG: No results for input(s): GLUCAP in the last 168 hours. Lipid Profile: No results for input(s): CHOL, HDL, LDLCALC, TRIG, CHOLHDL, LDLDIRECT in the last 72 hours. Thyroid Function Tests: No results for input(s): TSH, T4TOTAL, FREET4, T3FREE, THYROIDAB in the last 72 hours. Anemia Panel: No results for input(s): VITAMINB12, FOLATE, FERRITIN, TIBC, IRON, RETICCTPCT in the last 72 hours. Urine analysis:    Component Value Date/Time   BILIRUBINUR neg 08/19/2018 0937   PROTEINUR Negative 08/19/2018 0937   UROBILINOGEN 0.2 08/19/2018 0937   NITRITE neg 08/19/2018 0937   LEUKOCYTESUR Negative 08/19/2018 0937    Radiological Exams on Admission: CT Head Wo Contrast  Result Date: 01/12/2020 CLINICAL DATA:  Altered mental status with insomnia and agitation EXAM: CT HEAD WITHOUT CONTRAST TECHNIQUE: Contiguous axial images were obtained from the base of the skull through the vertex without intravenous contrast. COMPARISON:  None. FINDINGS: Brain: Ventricles and sulci are normal in size and configuration. There is no intracranial mass, hemorrhage, extra-axial fluid collection, or midline shift. Brain parenchyma appears unremarkable. No evident acute infarct. Vascular: No hyperdense vessel.  No evident vascular calcification. Skull: Bony calvarium appears intact. Sinuses/Orbits: There is mucosal thickening in several ethmoid air cells. There is opacification in a portion of  the superior left sphenoid sinus. Visualized orbits appear symmetric bilaterally. Other: Mastoid air cells are clear. IMPRESSION: Areas of paranasal sinus disease, primarily in the left sphenoid sinus region. Study otherwise unremarkable. Electronically Signed   By: Bretta Bang III M.D.   On: 01/12/2020 12:22    EKG: Independently reviewed.   Assessment/Plan Active Problems:   * No active hospital problems. *  Confusion: etiology unclear, possibly meningitis vs encephalitis vs sleep deprivation vs global transient amnesia vs psych disorder. CT brain showed areas of paranasal sinus disease, primarily in the left sphenoid sinus region, otherwise no acute findings. ER physician placed the pt on IVC. Pt has not slept in 2.5 days. Urine drug neg. TSH, FT4, HIV pending. Blood cxs ordered. UA is positive for ketones but neg for glucose. LP w/ studies ordered in ER.  Psych &  neuro consulted. NPO, will consult speech when pt is more appropriate   Lactic acidosis: etiology unclear. Continue on IVFs. Repeat lactic acid ordered  Hx of depression: not on any anti-depressants at home   Leukocytosis: likely reactive vs infection. Will continue to monitor  Hyperglycemia: no hx of DM. Will continue to monitor  Microcytosis w/o anemia: will check iron panel.    DVT prophylaxis: SCDs Code Status: full  Family Communication: discussed pt's care w/ pt's husband, Mr. Bridgett Larsson, answered his questions. Will likely need an interpreter  Disposition Plan:  Consults called: neuro (Dr. Doy Mince), psych (Dr. Satira Sark) Admission status: observation    Wyvonnia Dusky MD [facility] Pager [phone]  If 7PM-7AM, please contact night-coverage [email or web address]   01/12/2020, 3:54 PM
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
## 2020-01-12 NOTE — ED Notes (Signed)
Dr Colon Branch notified of elevated temp and HR.

## 2020-01-12 NOTE — ED Notes (Addendum)
Pt began kicking at staff, speaking in both [place] and [place]. Ankle restraints applied per MD order.  + pedal pulses and skin color wnl.  Able to get 2 fingers in between restraints and pts skin.  Pt still thrashing around in bed but unable to kick staff or throw legs over rails of stretcher.

## 2020-01-12 NOTE — ED Notes (Signed)
Husband's phone number: Rocky Crafts [phone]  Willeen Cass (son) [phone]

## 2020-01-12 NOTE — ED Triage Notes (Signed)
Arrives with husband who states patient has not slept in 2.5 days. Patient was restless and agitated last night.  Husband states she is acting abnormally today.  Patient speaks Mandarin.

## 2020-01-12 NOTE — ED Notes (Signed)
Patient belongings:  1 pair black sneakers 1 grey jacket 1 green long sleeved shirt 1 pair socks underware Blue leggins
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
## 2020-01-12 NOTE — Sedation Documentation (Signed)
Pt placed on 2L nasal cannula.

## 2020-01-12 NOTE — ED Notes (Signed)
IVC/Being admitted Medically/ Consult completed/

## 2020-01-12 NOTE — ED Notes (Signed)
Pt asleep.  Restraints x4 intact with + pedal and radial pulses, skin warm and dry and color is WNL.  Loosened bilat ankle restraints.  Will continue to monitor.

## 2020-01-12 NOTE — Consult Note (Signed)
[facility] Face-to-Face Psychiatry Consult   Reason for Consult:  MS change Referring Physician:  EDMDPatient Identification: Marissa Walsh MRN:  [id number] Principal Diagnosis: <principal problem not specified> Diagnosis:  Active Problems:   Confusion   Total Time spent with patient: 15 minutes  Subjective:   Marissa Walsh is a 39 y.o. female patient admitted with MS change.For me she varies between screaming and obtunded and unresponsive. She does not match any manic or pyschiatric pathology. Suspect medical encephalopathy and continued work up. Likely infectious etiology.  HPI:  Marissa Walsh is a 39 y.o. female past medical history as below, who presents to the emergency department with her husband for abnormal behavior. Husband states the patient has not slept in 2.5 days.  Patient was reportedly restless and agitated last night and acting abnormally today. He states she is not making suicidal statements, but is saying she is dying, and saying sorry. He denies any major recent life changes, aside from starting to talk about buying a house. Does not take any daily psychiatric medications, has a history of major depressive episode on 2018.   History obtained with assistance of Mandarin interpreter. First interviewed with patient alone, and then with husband (and interpreter) for supplementation.   History extremely limited as patient is a difficult historian. She seems confused and essentially just repeats back what the interpreter says to her in [place]. She asks repetitively, "who should I look at [facility] or interpreter]?" And then seems to become distressed and states "I can't see anything!" Then looks at her husband and says she can see her husband.   Husband says her behavior is abnormal. No history of same. Husband denies any alcohol or drug use.   Past Psychiatric History: depression. No mania or substance use.  Risk to Self:   Risk to Others:   Prior Inpatient Therapy:   Prior Outpatient  Therapy:    Past Medical History:  Past Medical History:  Diagnosis Date  . Asthma   . Bacterial vaginosis   . Ovarian cyst   . Vulvar dermatitis    History reviewed. No pertinent surgical history. Family History:  Family History  Problem Relation Age of Onset  . Hyperlipidemia Mother   . Hypertension Mother   . Diabetes Mother   . Cancer Father 14       colon cancer   Family Psychiatric  History:  Social History:  Social History   Substance and Sexual Activity  Alcohol Use No     Social History   Substance and Sexual Activity  Drug Use No    Social History   Socioeconomic History  . Marital status: Married    Spouse name: Not on file  . Number of children: Not on file  . Years of education: Not on file  . Highest education level: Not on file  Occupational History  . Not on file  Tobacco Use  . Smoking status: Never Smoker  . Smokeless tobacco: Never Used  Substance and Sexual Activity  . Alcohol use: No  . Drug use: No  . Sexual activity: Never  Other Topics Concern  . Not on file  Social History Narrative   Working [employer].    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    Lives with husband.   3 boys; no complications for pregnancies.      Social Determinants of Health   Financial Resource Strain:   . Difficulty of Paying Living Expenses:   Food  Insecurity:   . Worried About Programme researcher, broadcasting/film/video in the Last Year:   . Barista in the Last Year:   Transportation Needs:   . Freight forwarder (Medical):   Marland Kitchen Lack of Transportation (Non-Medical):   Physical Activity:   . Days of Exercise per Week:   . Minutes of Exercise per Session:   Stress:   . Feeling of Stress :   Social Connections:   . Frequency of Communication with Friends and Family:   . Frequency of Social Gatherings with Friends and Family:   . Attends Religious Services:   . Active Member of Clubs or Organizations:   . Attends Tax inspector Meetings:   Marland Kitchen Marital Status:    Additional Social History:    Allergies:   Allergies  Allergen Reactions  . Shellfish Allergy Itching  . Alcohol-Sulfur [Sulfur] Itching    Labs:  Results for orders placed or performed during the hospital encounter of 01/12/20 (from the past 48 hour(s))  Comprehensive metabolic panel     Status: Abnormal   Collection Time: 01/12/20 10:58 AM  Result Value Ref Range   Sodium 140 135 - 145 mmol/L   Potassium 3.5 3.5 - 5.1 mmol/L   Chloride 103 98 - 111 mmol/L   CO2 26 22 - 32 mmol/L   Glucose, Bld 109 (H) 70 - 99 mg/dL    Comment: Glucose reference range applies only to samples taken after fasting for at least 8 hours.   BUN 14 6 - 20 mg/dL   Creatinine, Ser 6.30 0.44 - 1.00 mg/dL   Calcium 8.9 8.9 - 16.0 mg/dL   Total Protein 8.0 6.5 - 8.1 g/dL   Albumin 4.4 3.5 - 5.0 g/dL   AST 21 15 - 41 U/L   ALT 20 0 - 44 U/L   Alkaline Phosphatase 50 38 - 126 U/L   Total Bilirubin 0.8 0.3 - 1.2 mg/dL   GFR calc non Af Amer >60 >60 mL/min   GFR calc Af Amer >60 >60 mL/min   Anion gap 11 5 - 15    Comment: Performed at [facility], [address] [place], [place] [address]  Ethanol     Status: None   Collection Time: 01/12/20 10:58 AM  Result Value Ref Range   Alcohol, Ethyl (B) <10 <10 mg/dL    Comment: (NOTE) Lowest detectable limit for serum alcohol is 10 mg/dL. For medical purposes only. Performed at [facility], [address] [place], [place] [address]   Salicylate level     Status: Abnormal   Collection Time: 01/12/20 10:58 AM  Result Value Ref Range   Salicylate Lvl <7.0 (L) 7.0 - 30.0 mg/dL    Comment: Performed at [facility], [address] [place], [place] [address]  Acetaminophen level     Status: Abnormal   Collection Time: 01/12/20 10:58 AM  Result Value Ref Range   Acetaminophen (Tylenol), Serum <10 (L) 10 - 30 ug/mL    Comment: (NOTE) Therapeutic concentrations vary  significantly. A range of 10-30 ug/mL  may be an effective concentration for many patients. However, some  are best treated at concentrations outside of this range. Acetaminophen concentrations >150 ug/mL at 4 hours after ingestion  and >50 ug/mL at 12 hours after ingestion are often associated with  toxic reactions. Performed at [facility], [address] [place], [place] [address]   cbc     Status: Abnormal   Collection  Time: 01/12/20 10:58 AM  Result Value Ref Range   WBC 10.6 (H) 4.0 - 10.5 K/uL   RBC 5.81 (H) 3.87 - 5.11 MIL/uL   Hemoglobin 12.5 12.0 - 15.0 g/dL   HCT 48.1 85.6 - 31.4 %   MCV 65.4 (L) 80.0 - 100.0 fL   MCH 21.5 (L) 26.0 - 34.0 pg   MCHC 32.9 30.0 - 36.0 g/dL   RDW 97.0 (H) 26.3 - 78.5 %   Platelets 273 150 - 400 K/uL   nRBC 0.0 0.0 - 0.2 %    Comment: Performed at [facility], [address] [place], [place] [address]  Urinalysis, Complete w Microscopic     Status: Abnormal   Collection Time: 01/12/20  4:06 PM  Result Value Ref Range   Color, Urine YELLOW (A) YELLOW   APPearance CLEAR (A) CLEAR   Specific Gravity, Urine 1.021 1.005 - 1.030   pH 6.0 5.0 - 8.0   Glucose, UA NEGATIVE NEGATIVE mg/dL   Hgb urine dipstick NEGATIVE NEGATIVE   Bilirubin Urine NEGATIVE NEGATIVE   Ketones, ur 20 (A) NEGATIVE mg/dL   Protein, ur NEGATIVE NEGATIVE mg/dL   Nitrite NEGATIVE NEGATIVE   Leukocytes,Ua NEGATIVE NEGATIVE   RBC / HPF 0-5 0 - 5 RBC/hpf   WBC, UA 0-5 0 - 5 WBC/hpf   Bacteria, UA NONE SEEN NONE SEEN   Squamous Epithelial / LPF 0-5 0 - 5   Mucus PRESENT     Comment: Performed at [facility], [address] [place], [place] [address]  Lactic acid, plasma     Status: Abnormal   Collection Time: 01/12/20  4:06 PM  Result Value Ref Range   Lactic Acid, Venous 4.1 (HH) 0.5 - 1.9 mmol/L    Comment: CRITICAL RESULT CALLED TO, READ BACK BY AND VERIFIED WITH ANNIE [facility] 01/12/20 1644 KLW Performed at [facility],  [address] [place], [place] [address]     Current Facility-Administered Medications  Medication Dose Route Frequency Provider Last Rate Last Admin  . 0.9 %  sodium chloride infusion   Intravenous Continuous Charise Killian, MD      . bisacodyl (DULCOLAX) EC tablet 5 mg  5 mg Oral Daily PRN Charise Killian, MD      . enoxaparin (LOVENOX) injection 40 mg  40 mg Subcutaneous Q24H Charise Killian, MD      . fentaNYL (SUBLIMAZE) injection 100 mcg  100 mcg Intravenous Once Dionne Bucy, MD      . ondansetron [facility]) tablet 4 mg  4 mg Oral Q6H PRN Charise Killian, MD       Or  . ondansetron  [facility]) injection 4 mg  4 mg Intravenous Q6H PRN Charise Killian, MD      . sodium chloride flush (NS) 0.9 % injection 3 mL  3 mL Intravenous Q12H Charise Killian, MD      . traMADol Janean Sark) tablet 50 mg  50 mg Oral Q8H PRN Charise Killian, MD       Current Outpatient Medications  Medication Sig Dispense Refill  . fexofenadine (ALLEGRA ALLERGY) 180 MG tablet Take 1 tablet (180 mg total) by mouth daily. 30 tablet 5  . hydrOXYzine (ATARAX/VISTARIL) 25 MG tablet Take 1 tablet (25 mg total) by mouth 3 (three) times daily as needed for itching. 30 tablet 0  . ibuprofen (ADVIL,MOTRIN) 600 MG tablet Take 1 tablet (600 mg total) by mouth every 8 (eight) hours as needed. 30 tablet 0  .  PROAIR HFA 108 (90 Base) MCG/ACT inhaler INHALE 1-2 PUFFS INTO THE LUNGS EVERY 6 HOURS AS NEEDED. 8.5 Inhaler 0    Psychiatric Specialty Exam: Physical Exam  Review of Systems    General Appearance: Bizarre variation between screeming and total immobility  Eye Contact:  Absent  Speech:  Negative  Volume:  none  Mood:  Negative  Affect:  Negative  Thought Process:  Disorganized  Orientation:  Negative  Thought Content:  Negative  Suicidal Thoughts:  unknown  Homicidal Thoughts: unknown  Memory:unknown  Judgement:  unknown  Insight: unknown  Psychomotor Activity:  Decreased   Concentration:  Concentration: Negative  Recall:  Negative  Fund of Knowledge:  Negative  Language:  Negative  Akathisia:  Negative  Handed:  Ambidextrous  AIMS (if indicated):     Assets:  Others:  husband support  ADL's:  Impaired  Cognition:  Impaired,  Severe  Sleep:      Picking motion of left fingers and evidence of skin damage from picking. Erratic behavior. Post Ativan 2mg  she is mostly unresponsive.  Treatment Plan Summary: Need to complete medical eval. Recommend neuro consult and more aggressive medical evaluation. Doubt psychiatric cause of behavior  Disposition: undetermined that this time.  , MD 01/12/2020 4:50 PM

## 2020-01-12 NOTE — ED Notes (Signed)
Transported to CT 

## 2020-01-12 NOTE — ED Notes (Signed)
4 staff members in to hold pt while IV gets started.  Med given.  Pt now sleeping

## 2020-01-12 NOTE — ED Notes (Signed)
Pt screaming and yelling in hallway.  Tearful.  Unable to console.  Husband remains at bedside. Waiting on psych consult.

## 2020-01-12 NOTE — ED Notes (Addendum)
Pt threw the purewick on the floor.  Asked for some water.  Ice water provided.  Pt removing monitor leads,  Refuses to keep them on.  Pt thinks she is in [place] and does not believe she is in [place].

## 2020-01-12 NOTE — Sedation Documentation (Signed)
MD Siadecki performing lumbar puncture at this time.

## 2020-01-12 NOTE — ED Notes (Signed)
Pt refusing to keep vital sign equipment on. MD Siadecki aware and okay with pt remaining off. Sitter to remain at bedside 1 to 1

## 2020-01-12 NOTE — ED Notes (Addendum)
Chaplain at bedside to pray with patient and husband. Pt tearful and keeps saying she is scared but will not say why

## 2020-01-13 DIAGNOSIS — D509 Iron deficiency anemia, unspecified: Secondary | ICD-10-CM | POA: Diagnosis present

## 2020-01-13 DIAGNOSIS — Z8249 Family history of ischemic heart disease and other diseases of the circulatory system: Secondary | ICD-10-CM | POA: Diagnosis not present

## 2020-01-13 DIAGNOSIS — E559 Vitamin D deficiency, unspecified: Secondary | ICD-10-CM | POA: Diagnosis present

## 2020-01-13 DIAGNOSIS — Z91013 Allergy to seafood: Secondary | ICD-10-CM | POA: Diagnosis not present

## 2020-01-13 DIAGNOSIS — R410 Disorientation, unspecified: Secondary | ICD-10-CM | POA: Diagnosis present

## 2020-01-13 DIAGNOSIS — D569 Thalassemia, unspecified: Secondary | ICD-10-CM | POA: Diagnosis present

## 2020-01-13 DIAGNOSIS — D72829 Elevated white blood cell count, unspecified: Secondary | ICD-10-CM | POA: Diagnosis present

## 2020-01-13 DIAGNOSIS — R739 Hyperglycemia, unspecified: Secondary | ICD-10-CM | POA: Diagnosis present

## 2020-01-13 DIAGNOSIS — E872 Acidosis: Secondary | ICD-10-CM | POA: Diagnosis present

## 2020-01-13 DIAGNOSIS — Z833 Family history of diabetes mellitus: Secondary | ICD-10-CM | POA: Diagnosis not present

## 2020-01-13 DIAGNOSIS — Z781 Physical restraint status: Secondary | ICD-10-CM | POA: Diagnosis not present

## 2020-01-13 DIAGNOSIS — G9341 Metabolic encephalopathy: Secondary | ICD-10-CM | POA: Diagnosis present

## 2020-01-13 DIAGNOSIS — Z72820 Sleep deprivation: Secondary | ICD-10-CM | POA: Diagnosis not present

## 2020-01-13 DIAGNOSIS — J45909 Unspecified asthma, uncomplicated: Secondary | ICD-10-CM | POA: Diagnosis present

## 2020-01-13 DIAGNOSIS — Z83438 Family history of other disorder of lipoprotein metabolism and other lipidemia: Secondary | ICD-10-CM | POA: Diagnosis not present

## 2020-01-13 DIAGNOSIS — Z800 Family history of malignant neoplasm of digestive organs: Secondary | ICD-10-CM | POA: Diagnosis not present

## 2020-01-13 DIAGNOSIS — Z20822 Contact with and (suspected) exposure to covid-19: Secondary | ICD-10-CM | POA: Diagnosis present

## 2020-01-13 LAB — BASIC METABOLIC PANEL
Anion gap: 10 (ref 5–15)
BUN: 8 mg/dL (ref 6–20)
CO2: 23 mmol/L (ref 22–32)
Calcium: 7.7 mg/dL — ABNORMAL LOW (ref 8.9–10.3)
Chloride: 103 mmol/L (ref 98–111)
Creatinine, Ser: 0.45 mg/dL (ref 0.44–1.00)
GFR calc Af Amer: >60 mL/min (ref >60)
GFR calc non Af Amer: >60 mL/min (ref >60)
Glucose, Bld: 124 mg/dL — ABNORMAL HIGH (ref 70–99)
Potassium: 3.4 mmol/L — ABNORMAL LOW (ref 3.5–5.1)
Sodium: 136 mmol/L (ref 135–145)

## 2020-01-13 LAB — CBC
HCT: 31.9 % — ABNORMAL LOW (ref 36.0–46.0)
Hemoglobin: 10.1 g/dL — ABNORMAL LOW (ref 12.0–15.0)
MCH: 21.8 pg — ABNORMAL LOW (ref 26.0–34.0)
MCHC: 31.7 g/dL (ref 30.0–36.0)
MCV: 68.8 fL — ABNORMAL LOW (ref 80.0–100.0)
Platelets: 196 10*3/uL (ref 150–400)
RBC: 4.64 MIL/uL (ref 3.87–5.11)
RDW: 15.3 % (ref 11.5–15.5)
WBC: 9.7 10*3/uL (ref 4.0–10.5)
nRBC: 0 % (ref 0.0–0.2)

## 2020-01-13 LAB — FERRITIN: Ferritin: 31 ng/mL (ref 11–307)

## 2020-01-13 LAB — SYPHILIS: RPR W/REFLEX TO RPR TITER AND TREPONEMAL ANTIBODIES, TRADITIONAL SCREENING AND DIAGNOSIS ALGORITHM: RPR Ser Ql: NONREACTIVE

## 2020-01-13 MED ORDER — ACETAMINOPHEN 325 MG PO TABS
650.0000 mg | ORAL_TABLET | Freq: Four times a day (QID) | ORAL | Status: DC | PRN
  Administered 2020-01-13: 650 mg via ORAL
  Filled 2020-01-13: qty 2

## 2020-01-13 MED ORDER — CHLORHEXIDINE GLUCONATE CLOTH 2 % EX PADS
6.0000 | MEDICATED_PAD | Freq: Every day | CUTANEOUS | Status: DC
  Administered 2020-01-14: 6 via TOPICAL

## 2020-01-13 MED ORDER — ALPRAZOLAM 0.5 MG PO TABS: 0.5000 mg | ORAL_TABLET | Freq: Three times a day (TID) | ORAL | Status: DC

## 2020-01-13 MED ORDER — LORAZEPAM 2 MG/ML IJ SOLN
2.0000 mg | Freq: Once | INTRAMUSCULAR | Status: AC
  Administered 2020-01-13: 1 mg via INTRAVENOUS
  Filled 2020-01-13: qty 1

## 2020-01-13 NOTE — ED Notes (Signed)
Bilateral wrist restraints still in place. Bilateral ankle restraints loosened but in place as precaution..+Radialand +pedalpulses w/ skin warm and dry and color WNL. Able to get 2 fingers in between restraints and pts skin.

## 2020-01-13 NOTE — ED Notes (Signed)
Bilateral wrist restraints still in place. Bilateral ankle restraints remain loosened but in place as precaution.  + Radial and +pedal pulses w/ skin warm and dry and color WNL. No other needs identified. This tech remains 1:1.

## 2020-01-13 NOTE — ED Notes (Signed)
Pt assisted off bedpan, no voiding noted. Pt is restless and slightly agitated, attempting to grab this RN. This RN attempted to calm and redirect, with minimal success. NT at bedside for 1:1 monitoring

## 2020-01-13 NOTE — ED Notes (Signed)
Patient is resting comfortably. Bilateral wrist restraints still in place. Bilateral ankle restraints remain loosened but in place as precaution. ..+Radial pulses w/ skin warm and dry and color WNL.Able to get 2 fingers in between restraints and pts skin.

## 2020-01-13 NOTE — ED Notes (Signed)
assisted pt on bedpad. Notified RN

## 2020-01-13 NOTE — Progress Notes (Signed)
Nurse reports patient metabolic encephalopthy, confusion, agitaiton having repeated episodes of agitations including biting and kicking at nurses.  Scheduled xanax and prn lorazepam ordered. Restraints renewed

## 2020-01-13 NOTE — Consult Note (Signed)
[facility] Face-to-Face Psychiatry Consult   Reason for Consult:  MS change Referring Physician:  ER MD Patient Identification: Marissa Walsh MRN:  [id number] Principal Diagnosis: <principal problem not specified> Diagnosis:  Active Problems:   Confusion   Acute metabolic encephalopathy   Total Time spent with patient: 20 minutes  Subjective today  Marissa Walsh is a 39 y.o. female patient admitted with fluctuating MS  Much of the history below has resolved. She is still tired but sits up and converses easily. MS is still not entirely clear although this may be the exhaustion from the last days event as well. It is actually pretty easy to understand her without an interpreter. Similarly her husband can communicate easily.  One explanation may be a supplement she was taking which may have be laced with an unknown substance. She describes TK90 a protein burst she that she has been taking for the past month ([email or web address]). I expect that she has been taking other supplements of unknown quality and validity.   Subjective yesterday:   Marissa Walsh is a 39 y.o. female patient admitted with MS change.For me she varies between screaming and obtunded and unresponsive. She does not match any manic or pyschiatric pathology. Suspect medical encephalopathy and continued work up. Likely infectious etiology.  HPI:  Marissa Walsh a 39 y.o.femalepast medical history as below, who presents to the emergency department with her husbandfor abnormal behavior. Husbandstates the patient has not slept in 2.5days. Patient was reportedly restless and agitated last night and acting abnormally today. He states she is not making suicidal statements, but is saying she is dying, and saying sorry. He denies any major recent life changes, aside from starting to talk about buying a house.Does not take any daily psychiatric medications, has a history of major depressive episode on 2018.  History obtained with assistance of Mandarin  interpreter. First interviewedwith patient alone, and then with husband (and interpreter)for supplementation.   History extremely limited as patient isa difficulthistorian. She seems confused andessentially justrepeats back what the interpretersays to her in Mandarin. She asks repetitively, "who should I look at[interviewer or interpreter]?" And thenseems to become distressed andstates "I can't see anything!" Then looks at her husband and says she can see her husband.  Husband says her behavior is abnormal. No history of same. Husband denies any alcohol or drug use.  Past Psychiatric History: depression. No mania or substance use.  Risk to Self:   Risk to Others:   Prior Inpatient Therapy:   Prior Outpatient Therapy:    Past Medical History:  Past Medical History:  Diagnosis Date  . Asthma   . Bacterial vaginosis   . Ovarian cyst   . Vulvar dermatitis    History reviewed. No pertinent surgical history. Family History:  Family History  Problem Relation Age of Onset  . Hyperlipidemia Mother   . Hypertension Mother   . Diabetes Mother   . Cancer Father 15       colon cancer   Family Psychiatric  History: Social History:  Social History   Substance and Sexual Activity  Alcohol Use No     Social History   Substance and Sexual Activity  Drug Use No    Social History   Socioeconomic History  . Marital status: Married    Spouse name: Not on file  . Number of children: Not on file  . Years of education: Not on file  . Highest education level: Not on file  Occupational History  . Not on file  Tobacco Use  . Smoking status: Never Smoker  . Smokeless tobacco: Never Used  Substance and Sexual Activity  . Alcohol use: No  . Drug use: No  . Sexual activity: Never  Other Topics Concern  . Not on file  Social History Narrative   Working [employer].    Mandarin [place];    Has been in [place] for 2 years.    Lives with husband.   3 boys;  no complications for pregnancies.      Social Determinants of Health   Financial Resource Strain:   . Difficulty of Paying Living Expenses:   Food Insecurity:   . Worried About Charity fundraiser in the Last Year:   . Arboriculturist in the Last Year:   Transportation Needs:   . Film/video editor (Medical):   Marland Kitchen Lack of Transportation (Non-Medical):   Physical Activity:   . Days of Exercise per Week:   . Minutes of Exercise per Session:   Stress:   . Feeling of Stress :   Social Connections:   . Frequency of Communication with Friends and Family:   . Frequency of Social Gatherings with Friends and Family:   . Attends Religious Services:   . Active Member of Clubs or Organizations:   . Attends Archivist Meetings:   Marland Kitchen Marital Status:    Additional Social History:    Allergies:   Allergies  Allergen Reactions  . Shellfish Allergy Itching  . Alcohol-Sulfur [Sulfur] Itching    Labs:  Results for orders placed or performed during the hospital encounter of 01/12/20 (from the past 48 hour(s))  Comprehensive metabolic panel     Status: Abnormal   Collection Time: 01/12/20 10:58 AM  Result Value Ref Range   Sodium 140 135 - 145 mmol/L   Potassium 3.5 3.5 - 5.1 mmol/L   Chloride 103 98 - 111 mmol/L   CO2 26 22 - 32 mmol/L   Glucose, Bld 109 (H) 70 - 99 mg/dL    Comment: Glucose reference range applies only to samples taken after fasting for at least 8 hours.   BUN 14 6 - 20 mg/dL   Creatinine, Ser 0.50 0.44 - 1.00 mg/dL   Calcium 8.9 8.9 - 10.3 mg/dL   Total Protein 8.0 6.5 - 8.1 g/dL   Albumin 4.4 3.5 - 5.0 g/dL   AST 21 15 - 41 U/L   ALT 20 0 - 44 U/L   Alkaline Phosphatase 50 38 - 126 U/L   Total Bilirubin 0.8 0.3 - 1.2 mg/dL   GFR calc non Af Amer >60 >60 mL/min   GFR calc Af Amer >60 >60 mL/min   Anion gap 11 5 - 15    Comment: Performed at [facility], [address], [place], [place] [address]  Ethanol     Status: None   Collection  Time: 01/12/20 10:58 AM  Result Value Ref Range   Alcohol, Ethyl (B) <10 <10 mg/dL    Comment: (NOTE) Lowest detectable limit for serum alcohol is 10 mg/dL. For medical purposes only. Performed at [facility], [address] [place], [place] [address]   Salicylate level     Status: Abnormal   Collection Time: 01/12/20 10:58 AM  Result Value Ref Range   Salicylate Lvl <5.1 (L) 7.0 - 30.0 mg/dL    Comment: Performed at [facility], [address], [place], [place] [address]  Acetaminophen level     Status: Abnormal  Collection Time: 01/12/20 10:58 AM  Result Value Ref Range   Acetaminophen (Tylenol), Serum <10 (L) 10 - 30 ug/mL    Comment: (NOTE) Therapeutic concentrations vary significantly. A range of 10-30 ug/mL  may be an effective concentration for many patients. However, some  are best treated at concentrations outside of this range. Acetaminophen concentrations >150 ug/mL at 4 hours after ingestion  and >50 ug/mL at 12 hours after ingestion are often associated with  toxic reactions. Performed at [facility], [address] [place], [place] [address]   cbc     Status: Abnormal   Collection Time: 01/12/20 10:58 AM  Result Value Ref Range   WBC 10.6 (H) 4.0 - 10.5 K/uL   RBC 5.81 (H) 3.87 - 5.11 MIL/uL   Hemoglobin 12.5 12.0 - 15.0 g/dL   HCT 60.438.0 54.036.0 - 98.146.0 %   MCV 65.4 (L) 80.0 - 100.0 fL   MCH 21.5 (L) 26.0 - 34.0 pg   MCHC 32.9 30.0 - 36.0 g/dL   RDW 19.115.9 (H) 47.811.5 - 29.515.5 %   Platelets 273 150 - 400 K/uL   nRBC 0.0 0.0 - 0.2 %    Comment: Performed at [facility], [address] [place], [place] [address]  T4, free     Status: None   Collection Time: 01/12/20 10:58 AM  Result Value Ref Range   Free T4 1.12 0.61 - 1.12 ng/dL    Comment: (NOTE) Biotin ingestion may interfere with free T4 tests. If the results are inconsistent with the TSH level, previous test results, or the clinical presentation, then consider  biotin interference. If needed, order repeat testing after stopping biotin. Performed at [facility], [address] [place], [place] [address]   TSH     Status: None   Collection Time: 01/12/20 10:58 AM  Result Value Ref Range   TSH 1.019 0.350 - 4.500 uIU/mL    Comment: Performed by a 3rd Generation assay with a functional sensitivity of <=0.01 uIU/mL. Performed at [facility], [address] [place], [place] [address]   Iron and TIBC     Status: None   Collection Time: 01/12/20 10:58 AM  Result Value Ref Range   Iron 70 28 - 170 ug/dL   TIBC 952420 841250 - 324450 ug/dL   Saturation Ratios 17 10.4 - 31.8 %   UIBC 350 ug/dL    Comment: Performed at [facility], [address] [place], [place] [address]  Urine Drug Screen, Qualitative     Status: None   Collection Time: 01/12/20  4:06 PM  Result Value Ref Range   Tricyclic, Ur Screen NONE DETECTED NONE DETECTED   Amphetamines, Ur Screen NONE DETECTED NONE DETECTED   MDMA (Ecstasy)Ur Screen NONE DETECTED NONE DETECTED   Cocaine Metabolite,Ur [place] NONE DETECTED NONE DETECTED   Opiate, Ur Screen NONE DETECTED NONE DETECTED   Phencyclidine (PCP) Ur S NONE DETECTED NONE DETECTED   Cannabinoid 50 Ng, Ur [place] NONE DETECTED NONE DETECTED   Barbiturates, Ur Screen NONE DETECTED NONE DETECTED   Benzodiazepine, Ur Scrn NONE DETECTED NONE DETECTED   Methadone Scn, Ur NONE DETECTED NONE DETECTED    Comment: (NOTE) Tricyclics + metabolites, urine    Cutoff 1000 ng/mL Amphetamines + metabolites, urine  Cutoff 1000 ng/mL MDMA (Ecstasy), urine              Cutoff 500 ng/mL Cocaine Metabolite, urine          Cutoff 300 ng/mL  Opiate + metabolites, urine        Cutoff 300 ng/mL Phencyclidine (PCP), urine         Cutoff 25 ng/mL Cannabinoid, urine                 Cutoff 50 ng/mL Barbiturates + metabolites, urine  Cutoff 200 ng/mL Benzodiazepine, urine              Cutoff 200 ng/mL Methadone, urine                    Cutoff 300 ng/mL The urine drug screen provides only a preliminary, unconfirmed analytical test result and should not be used for non-medical purposes. Clinical consideration and professional judgment should be applied to any positive drug screen result due to possible interfering substances. A more specific alternate chemical method must be used in order to obtain a confirmed analytical result. Gas chromatography / mass spectrometry (GC/MS) is the preferred confirmat ory method. Performed at [facility], [address] [place], [place] [address]   Urinalysis, Complete w Microscopic     Status: Abnormal   Collection Time: 01/12/20  4:06 PM  Result Value Ref Range   Color, Urine YELLOW (A) YELLOW   APPearance CLEAR (A) CLEAR   Specific Gravity, Urine 1.021 1.005 - 1.030   pH 6.0 5.0 - 8.0   Glucose, UA NEGATIVE NEGATIVE mg/dL   Hgb urine dipstick NEGATIVE NEGATIVE   Bilirubin Urine NEGATIVE NEGATIVE   Ketones, ur 20 (A) NEGATIVE mg/dL   Protein, ur NEGATIVE NEGATIVE mg/dL   Nitrite NEGATIVE NEGATIVE   Leukocytes,Ua NEGATIVE NEGATIVE   RBC / HPF 0-5 0 - 5 RBC/hpf   WBC, UA 0-5 0 - 5 WBC/hpf   Bacteria, UA NONE SEEN NONE SEEN   Squamous Epithelial / LPF 0-5 0 - 5   Mucus PRESENT     Comment: Performed at [facility], [address] [place], [place] [address]  RPR     Status: None   Collection Time: 01/12/20  4:06 PM  Result Value Ref Range   RPR Ser Ql NON REACTIVE NON REACTIVE    Comment: Performed at [facility], [address], [place], [place] [address]  Respiratory Panel by RT PCR (Flu A&B, Covid) - Nasopharyngeal Swab     Status: None   Collection Time: 01/12/20  4:06 PM   Specimen: Nasopharyngeal Swab  Result Value Ref Range   SARS Coronavirus 2 by RT PCR NEGATIVE NEGATIVE    Comment: (NOTE) SARS-CoV-2 target nucleic acids are NOT DETECTED. The SARS-CoV-2 RNA is generally detectable in upper respiratoy specimens during the acute phase of  infection. The lowest concentration of SARS-CoV-2 viral copies this assay can detect is 131 copies/mL. A negative result does not preclude SARS-Cov-2 infection and should not be used as the sole basis for treatment or other patient management decisions. A negative result may occur with  improper specimen collection/handling, submission of specimen other than nasopharyngeal swab, presence of viral mutation(s) within the areas targeted by this assay, and inadequate number of viral copies (<131 copies/mL). A negative result must be combined with clinical observations, patient history, and epidemiological information. The expected result is Negative. Fact Sheet for Patients:  https://www.moore.com/ Fact Sheet for Healthcare Providers:  https://www.young.biz/ This test is not yet ap proved or cleared by the [place] FDA and  has been authorized for detection and/or diagnosis of SARS-CoV-2 by FDA under an Emergency Use Authorization (EUA). This EUA will  remain  in effect (meaning this test can be used) for the duration of the COVID-19 declaration under Section 564(b)(1) of the Act, 21 U.S.C. section 360bbb-3(b)(1), unless the authorization is terminated or revoked sooner.    Influenza A by PCR NEGATIVE NEGATIVE   Influenza B by PCR NEGATIVE NEGATIVE    Comment: (NOTE) The Xpert Xpress SARS-CoV-2/FLU/RSV assay is intended as an aid in  the diagnosis of influenza from Nasopharyngeal swab specimens and  should not be used as a sole basis for treatment. Nasal washings and  aspirates are unacceptable for Xpert Xpress SARS-CoV-2/FLU/RSV  testing. Fact Sheet for Patients: https://www.moore.com/ Fact Sheet for Healthcare Providers: https://www.young.biz/ This test is not yet approved or cleared by the [place] FDA and  has been authorized for detection and/or diagnosis of SARS-CoV-2 by  FDA under an Emergency  Use Authorization (EUA). This EUA will remain  in effect (meaning this test can be used) for the duration of the  Covid-19 declaration under Section 564(b)(1) of the Act, 21  U.S.C. section 360bbb-3(b)(1), unless the authorization is  terminated or revoked. Performed at [facility] [address] [place], [place] [address]   Lactic acid, plasma     Status: Abnormal   Collection Time: 01/12/20  4:06 PM  Result Value Ref Range   Lactic Acid, Venous 4.1 (HH) 0.5 - 1.9 mmol/L    Comment: CRITICAL RESULT CALLED TO, READ BACK BY AND VERIFIED WITH ANNIE SMITH 01/12/20 1644 KLW Performed at [facility], [address] [place], [place] [address]   CSF cell count with differential collection tube #: 1     Status: Abnormal   Collection Time: 01/12/20  4:26 PM  Result Value Ref Range   Tube # 1    Color, CSF CLEAR (A) COLORLESS   Appearance, CSF COLORLESS (A) CLEAR   Supernatant CLEAR    RBC Count, CSF 0 0 - 3 /cu mm   WBC, CSF 0 0 - 5 /cu mm    Comment: TOO FEW TO COUNT, SMEAR AVAILABLE FOR REVIEW   Segmented Neutrophils-CSF 0 %   Lymphs, CSF 0 %   Monocyte-Macrophage-Spinal Fluid 0 %   Eosinophils, CSF 0 %    Comment: Performed at [facility], [address] [place], [place] [address]  Glucose, CSF     Status: Abnormal   Collection Time: 01/12/20  4:36 PM  Result Value Ref Range   Glucose, CSF 73 (H) 40 - 70 mg/dL    Comment: Performed at [facility], [address] [place], [place] [address]  Protein, CSF     Status: None   Collection Time: 01/12/20  4:36 PM  Result Value Ref Range   Total  Protein, CSF 27 15 - 45 mg/dL    Comment: Performed at [facility], [address] [place], [place] [address]  CSF cell count with differential collection tube #: 4     Status: Abnormal   Collection Time: 01/12/20  4:36 PM  Result Value Ref Range   Tube # 4    Color, CSF CLEAR (A) COLORLESS   Appearance, CSF COLORLESS (A) CLEAR    Supernatant CLEAR    RBC Count, CSF 9 (H) 0 - 3 /cu mm   WBC, CSF 0 0 - 5 /cu mm    Comment: TOO FEW TO COUNT, SMEAR AVAILABLE FOR REVIEW   Segmented Neutrophils-CSF 0 %   Lymphs, CSF 0 %   Monocyte-Macrophage-Spinal Fluid 0 %   Eosinophils,  CSF 0 %    Comment: Performed at [facility], [address] [place], [place] [address]  Lactic acid, plasma     Status: None   Collection Time: 01/12/20  5:47 PM  Result Value Ref Range   Lactic Acid, Venous 1.1 0.5 - 1.9 mmol/L    Comment: Performed at [facility], [address], [place], [place] [address]  CULTURE, BLOOD (ROUTINE X 2) w Reflex to ID Panel     Status: None (Preliminary result)   Collection Time: 01/12/20  5:47 PM   Specimen: BLOOD  Result Value Ref Range   Specimen Description BLOOD RIGHT ANTECUBITAL    Special Requests      BOTTLES DRAWN AEROBIC AND ANAEROBIC Blood Culture adequate volume   Culture      NO GROWTH < 24 HOURS Performed at [facility], [address], [place], [place] [address]    Report Status PENDING   CULTURE, BLOOD (ROUTINE X 2) w Reflex to ID Panel     Status: None (Preliminary result)   Collection Time: 01/12/20  5:50 PM   Specimen: BLOOD  Result Value Ref Range   Specimen Description BLOOD BLOOD LEFT FOREARM    Special Requests      BOTTLES DRAWN AEROBIC AND ANAEROBIC Blood Culture results may not be optimal due to an inadequate volume of blood received in culture bottles   Culture      NO GROWTH < 24 HOURS Performed at [facility], [address] [place], [place] [address]    Report Status PENDING   Ferritin     Status: None   Collection Time: 01/13/20  4:12 AM  Result Value Ref Range   Ferritin 31 11 - 307 ng/mL    Comment: Performed at [facility], [address] [place], [place] [address]  CBC     Status: Abnormal   Collection Time: 01/13/20  4:12 AM  Result Value Ref Range   WBC 9.7 4.0 - 10.5 K/uL   RBC 4.64 3.87 - 5.11 MIL/uL    Hemoglobin 10.1 (L) 12.0 - 15.0 g/dL   HCT 93.7 (L) 34.2 - 87.6 %   MCV 68.8 (L) 80.0 - 100.0 fL   MCH 21.8 (L) 26.0 - 34.0 pg   MCHC 31.7 30.0 - 36.0 g/dL   RDW 81.1 57.2 - 62.0 %   Platelets 196 150 - 400 K/uL   nRBC 0.0 0.0 - 0.2 %    Comment: Performed at [facility], [address] [place], [place] [address]  Basic metabolic panel     Status: Abnormal   Collection Time: 01/13/20  4:12 AM  Result Value Ref Range   Sodium 136 135 - 145 mmol/L   Potassium 3.4 (L) 3.5 - 5.1 mmol/L    Comment: HEMOLYSIS AT THIS LEVEL MAY AFFECT RESULT   Chloride 103 98 - 111 mmol/L   CO2 23 22 - 32 mmol/L   Glucose, Bld 124 (H) 70 - 99 mg/dL    Comment: Glucose reference range applies only to samples taken after fasting for at least 8 hours.   BUN 8 6 - 20 mg/dL   Creatinine, Ser 4.16 0.44 - 1.00 mg/dL   Calcium 7.7 (L) 8.9 - 10.3 mg/dL   GFR calc non Af Amer >60 >60 mL/min   GFR calc Af Amer >60 >60 mL/min   Anion gap 10 5 - 15    Comment: Performed at [facility], [address]  [place], [place] [address]    Current Facility-Administered Medications  Medication Dose Route Frequency Provider Last Rate Last Admin  . 0.9 %  sodium chloride infusion   Intravenous Continuous Charise Killian, MD 75 mL/hr at 01/13/20 1300 Rate Verify at 01/13/20 1300  . acetaminophen (TYLENOL) suppository 650 mg  650 mg Rectal Q4H PRN Charise Killian, MD      . acetaminophen (TYLENOL) tablet 650 mg  650 mg Oral Q6H PRN Calvert Cantor, MD   650 mg at 01/13/20 0957  . bisacodyl (DULCOLAX) EC tablet 5 mg  5 mg Oral Daily PRN Charise Killian, MD      . Chlorhexidine Gluconate Cloth 2 % PADS 6 each  6 each Topical Daily Rizwan, Ladell Heads, MD      . ondansetron (ZOFRAN) tablet 4 mg  4 mg Oral Q6H PRN Charise Killian, MD       Or  . ondansetron [facility]) injection 4 mg  4 mg Intravenous Q6H PRN Charise Killian, MD      . sodium chloride flush (NS) 0.9 % injection 3 mL  3 mL  Intravenous Q12H Charise Killian, MD   3 mL at 01/13/20 1301  . traMADol (ULTRAM) tablet 50 mg  50 mg Oral Q8H PRN Charise Killian, MD         Blood pressure (!) 149/109, pulse (!) 125, temperature 98 F (36.7 C), temperature source Oral, resp. rate 18, height  (1.575 m), weight 63 kg, last menstrual period 01/04/2020, SpO2 99 %.Body mass index is 25.4 kg/m.  General Appearance: Fairly Groomed  Eye Contact:  Fair  Speech:  Clear and Coherent  Volume:  Normal  Mood:  Euthymic  Affect:  Congruent  Thought Process:  Goal Directed  Orientation:  Full (Time, Place, and Person)  Thought Content:  Logical  Suicidal Thoughts:  No  Homicidal Thoughts:  No  Memory:  Immediate;   Fair  Judgement:  NA  Insight:  NA  Psychomotor Activity:  Decreased  Concentration:  Concentration: Fair  Recall:  NA  Fund of Knowledge:  NA  Language:  NA  Akathisia:  No  Handed:  Ambidextrous  AIMS (if indicated):     Assets:  Desire for Improvement Social Support  ADL's:  Intact  Cognition:  Impaired,  Mild  Sleep:      Spoke with MD about transfer to the ward to limit excess stimulation assume she remains stable. I do not see a value in an inpatient psychiatric admission  Treatment Plan Summary: Plan Reassess as needed. Likely not necessary. No need for psychiatric medications at this time.  Disposition: No evidence of imminent risk to self or others at present.   Patient does not meet criteria for psychiatric inpatient admission.  Cindy Hazy, MD 01/13/2020 5:20 PM

## 2020-01-13 NOTE — ED Notes (Signed)
Bilateral wrist restraints remain on pt. +Radialpulses w/ skin warm, dry, and intact, and color WNL. Vitals sign were rechecked. Pt removed pulse ox but kept b/p cuff on. B/p set to be recheck every hour. Offered water and toileting . Pt refused both. No other needs identified.

## 2020-01-13 NOTE — ED Notes (Signed)
Ankle restraints removed at this time

## 2020-01-13 NOTE — Procedures (Signed)
ELECTROENCEPHALOGRAM REPORT   Patient: Marissa Walsh       Room #: IC10A-AA EEG No. ID: [id number] Age: 39 y.o.        Sex: female Requesting Physician: Rizwan Report Date:  01/13/2020        Interpreting Physician: Thana Farr  History: [name] is an 39 y.o. female with altered mental status  Medications:  Ativan, Haldol  Conditions of Recording:  This is a 21 channel routine scalp EEG performed with bipolar and monopolar montages arranged in accordance to the international 10/20 system of electrode placement. One channel was dedicated to EKG recording.  The patient is in the awake, drowsy and asleep states.  Description:  The waking background activity consists of a low voltage, symmetrical, fairly well organized, 9 Hz alpha activity, seen from the parieto-occipital and posterior temporal regions.  Low voltage fast activity, poorly organized, is seen anteriorly and is at times superimposed on more posterior regions.  A mixture of theta and alpha rhythms are seen from the central and temporal regions. The patient drowses with slowing to irregular, low voltage theta and beta activity.   The patient goes in to a light sleep with symmetrical sleep spindles, vertex central sharp transients and irregular slow activity.  The patient has an episode of leg shaking with no change in the background noted.   No epileptiform activity is noted.   Hyperventilation and intermittent photic stimulation were not performed.  IMPRESSION: Normal electroencephalogram, awake and asleep. There are no focal lateralizing or epileptiform features.   Thana Farr, MD Neurology [phone] 01/13/2020, 4:59 PM

## 2020-01-13 NOTE — Progress Notes (Signed)
PROGRESS NOTE    Bralyn Espino   NAT:[id number]  DOB: [birth year]  DOA: 01/12/2020 PCP: Burnard Hawthorne, FNP   Brief Narrative:  Marissa Walsh is a 39 y/o F w/ PMH of depression, allergies, asthma who presents w/ confusion and severe agitation. She had not slept in 2 and a half days. She was noted to be aggressive and yelling in the ED and was sedated. In the ED she was noted to have a temp of 101. She was admitted for a medical work up for her mental status change.     Subjective: Slightly confused this AM when I evaluated her.     Assessment & Plan:   Active Problems:     Acute metabolic encephalopathy - still agitated this  AM, kicking staff and pulling out IVs- per psych, she was much less agitated when he evaluated her later in the day - appreciate neuro eval - medical work up including and LP and EEG is unrevealing as to the cause of her confusion - no recurrence of the fever - she was apparently taking some odd supplement at home called TH90 which she bought off the Internet which Dr Satira Sark (psych) thinks may have contributed - will follow without addition medications for now- keep sitter at bedside  Time spent in minutes: 35 DVT prophylaxis: SCDs Code Status: Full code Family Communication:  Disposition Plan: follow over night in hospital- possibly home in AM Consultants:   Psych  Neuro  Procedures:   EEG  LP Antimicrobials:  Anti-infectives (From admission, onward)   Start     Dose/Rate Route Frequency Ordered Stop   01/12/20 1800  acyclovir (ZOVIRAX) 630 mg in dextrose 5 % 100 mL IVPB     10 mg/kg  63 kg 112.6 mL/hr over 60 Minutes Intravenous  Once 01/12/20 1721 01/12/20 1904   01/12/20 1730  cefTRIAXone (ROCEPHIN) 2 g in sodium chloride 0.9 % 100 mL IVPB     2 g 200 mL/hr over 30 Minutes Intravenous  Once 01/12/20 1721 01/12/20 1828   01/12/20 1730  vancomycin (VANCOCIN) IVPB 1000 mg/200 mL premix     1,000 mg 200 mL/hr over 60 Minutes Intravenous  Once  01/12/20 1721 01/12/20 1904       Objective: Vitals:   01/13/20 1700 01/13/20 1757 01/13/20 1800 01/13/20 1830  BP: (!) 149/109  (!) 127/50   Pulse: (!) 125 (!) 113 (!) 114 (!) 121  Resp: 18 (!) 41 20 (!) 29  Temp:      TempSrc:      SpO2: 99% 97% 99% 99%  Weight:      Height:        Intake/Output Summary (Last 24 hours) at 01/13/2020 1915 Last data filed at 01/13/2020 1405 Gross per 24 hour  Intake 346.14 ml  Output --  Net 346.14 ml   Filed Weights   01/12/20 1055  Weight: 63 kg    Examination: General exam: Appears comfortable  HEENT: PERRLA, oral mucosa moist, no sclera icterus or thrush Respiratory system: Clear to auscultation. Respiratory effort normal. Cardiovascular system: S1 & S2 heard, RRR.   Gastrointestinal system: Abdomen soft, non-tender, nondistended. Normal bowel sounds. Central nervous system: Alert and oriented. No focal neurological deficits. Extremities: No cyanosis, clubbing or edema Skin: No rashes or ulcers Psychiatry:  Mood & affect appropriate.     Data Reviewed: I have personally reviewed following labs and imaging studies  CBC: Recent Labs  Lab 01/12/20 1058 01/13/20 0412  WBC 10.6* 9.7  HGB 12.5 10.1*  HCT 38.0 31.9*  MCV 65.4* 68.8*  PLT 273 196   Basic Metabolic Panel: Recent Labs  Lab 01/12/20 1058 01/13/20 0412  NA 140 136  K 3.5 3.4*  CL 103 103  CO2 26 23  GLUCOSE 109* 124*  BUN 14 8  CREATININE 0.50 0.45  CALCIUM 8.9 7.7*   GFR: Estimated Creatinine Clearance: 82.4 mL/min (by C-G formula based on SCr of 0.45 mg/dL). Liver Function Tests: Recent Labs  Lab 01/12/20 1058  AST 21  ALT 20  ALKPHOS 50  BILITOT 0.8  PROT 8.0  ALBUMIN 4.4   No results for input(s): LIPASE, AMYLASE in the last 168 hours. No results for input(s): AMMONIA in the last 168 hours. Coagulation Profile: No results for input(s): INR, PROTIME in the last 168 hours. Cardiac Enzymes: No results for input(s): CKTOTAL, CKMB,  CKMBINDEX, TROPONINI in the last 168 hours. BNP (last 3 results) No results for input(s): PROBNP in the last 8760 hours. HbA1C: No results for input(s): HGBA1C in the last 72 hours. CBG: No results for input(s): GLUCAP in the last 168 hours. Lipid Profile: No results for input(s): CHOL, HDL, LDLCALC, TRIG, CHOLHDL, LDLDIRECT in the last 72 hours. Thyroid Function Tests: Recent Labs    01/12/20 1058  TSH 1.019  FREET4 1.12   Anemia Panel: Recent Labs    01/12/20 1058 01/13/20 0412  FERRITIN  --  31  TIBC 420  --   IRON 70  --    Urine analysis:    Component Value Date/Time   COLORURINE YELLOW (A) 01/12/2020 1606   APPEARANCEUR CLEAR (A) 01/12/2020 1606   LABSPEC 1.021 01/12/2020 1606   PHURINE 6.0 01/12/2020 1606   GLUCOSEU NEGATIVE 01/12/2020 1606   HGBUR NEGATIVE 01/12/2020 1606   BILIRUBINUR NEGATIVE 01/12/2020 1606   BILIRUBINUR neg 08/19/2018 0937   KETONESUR 20 (A) 01/12/2020 1606   PROTEINUR NEGATIVE 01/12/2020 1606   UROBILINOGEN 0.2 08/19/2018 0937   NITRITE NEGATIVE 01/12/2020 1606   LEUKOCYTESUR NEGATIVE 01/12/2020 1606   Sepsis Labs: @LABRCNTIP (procalcitonin:4,lacticidven:4) ) Recent Results (from the past 240 hour(s))  Respiratory Panel by RT PCR (Flu A&B, Covid) - Nasopharyngeal Swab     Status: None   Collection Time: 01/12/20  4:06 PM   Specimen: Nasopharyngeal Swab  Result Value Ref Range Status   SARS Coronavirus 2 by RT PCR NEGATIVE NEGATIVE Final    Comment: (NOTE) SARS-CoV-2 target nucleic acids are NOT DETECTED. The SARS-CoV-2 RNA is generally detectable in upper respiratoy specimens during the acute phase of infection. The lowest concentration of SARS-CoV-2 viral copies this assay can detect is 131 copies/mL. A negative result does not preclude SARS-Cov-2 infection and should not be used as the sole basis for treatment or other patient management decisions. A negative result may occur with  improper specimen collection/handling,  submission of specimen other than nasopharyngeal swab, presence of viral mutation(s) within the areas targeted by this assay, and inadequate number of viral copies (<131 copies/mL). A negative result must be combined with clinical observations, patient history, and epidemiological information. The expected result is Negative. Fact Sheet for Patients:  01/14/20 Fact Sheet for Healthcare Providers:  https://www.moore.com/ This test is not yet ap proved or cleared by the https://www.young.biz/ FDA and  has been authorized for detection and/or diagnosis of SARS-CoV-2 by FDA under an Emergency Use Authorization (EUA). This EUA will remain  in effect (meaning this test can be used) for the duration of the COVID-19 declaration under Section 564(b)(1) of  the Act, 21 U.S.C. section 360bbb-3(b)(1), unless the authorization is terminated or revoked sooner.    Influenza A by PCR NEGATIVE NEGATIVE Final   Influenza B by PCR NEGATIVE NEGATIVE Final    Comment: (NOTE) The Xpert Xpress SARS-CoV-2/FLU/RSV assay is intended as an aid in  the diagnosis of influenza from Nasopharyngeal swab specimens and  should not be used as a sole basis for treatment. Nasal washings and  aspirates are unacceptable for Xpert Xpress SARS-CoV-2/FLU/RSV  testing. Fact Sheet for Patients: https://www.moore.com/ Fact Sheet for Healthcare Providers: https://www.young.biz/ This test is not yet approved or cleared by the [place] FDA and  has been authorized for detection and/or diagnosis of SARS-CoV-2 by  FDA under an Emergency Use Authorization (EUA). This EUA will remain  in effect (meaning this test can be used) for the duration of the  Covid-19 declaration under Section 564(b)(1) of the Act, 21  U.S.C. section 360bbb-3(b)(1), unless the authorization is  terminated or revoked. Performed at [facility], [address] [place], [place] [address]   CULTURE, BLOOD (ROUTINE X 2) w Reflex to ID Panel     Status: None (Preliminary result)   Collection Time: 01/12/20  5:47 PM   Specimen: BLOOD  Result Value Ref Range Status   Specimen Description BLOOD RIGHT ANTECUBITAL  Final   Special Requests   Final    BOTTLES DRAWN AEROBIC AND ANAEROBIC Blood Culture adequate volume   Culture   Final    NO GROWTH < 24 HOURS Performed at [facility], [address], [place], [place] [address]    Report Status PENDING  Incomplete  CULTURE, BLOOD (ROUTINE X 2) w Reflex to ID Panel     Status: None (Preliminary result)   Collection Time: 01/12/20  5:50 PM   Specimen: BLOOD  Result Value Ref Range Status   Specimen Description BLOOD BLOOD LEFT FOREARM  Final   Special Requests   Final    BOTTLES DRAWN AEROBIC AND ANAEROBIC Blood Culture results may not be optimal due to an inadequate volume of blood received in culture bottles   Culture   Final    NO GROWTH < 24 HOURS Performed at [facility], [address] [place], [place] [address]    Report Status PENDING  Incomplete         Radiology Studies: EEG  Result Date: 01/13/2020 Thana Farr, MD     01/13/2020  5:01 PM ELECTROENCEPHALOGRAM REPORT Patient: [name]       Room #: IC10A-AA EEG No. ID: [id number] Age: 38 y.o.        Sex: female Requesting Physician: Niani Mourer Report Date:  01/13/2020       Interpreting Physician: Thana Farr History: Wendelin Reader is an 39 y.o. female with altered mental status Medications: Ativan, Haldol Conditions of Recording:  This is a 21 channel routine scalp EEG performed with bipolar and monopolar montages arranged in accordance to the international 10/20 system of electrode placement. One channel was dedicated to EKG recording. The patient is in the awake, drowsy and asleep states. Description:  The waking background activity consists of a low voltage, symmetrical, fairly well organized, 9 Hz alpha activity, seen  from the parieto-occipital and posterior temporal regions.  Low voltage fast activity, poorly organized, is seen anteriorly and is at times superimposed on more posterior regions.  A mixture of theta and alpha rhythms are seen from the central and temporal regions. The patient drowses with slowing to irregular, low voltage  theta and beta activity.  The patient goes in to a light sleep with symmetrical sleep spindles, vertex central sharp transients and irregular slow activity. The patient has an episode of leg shaking with no change in the background noted.  No epileptiform activity is noted.  Hyperventilation and intermittent photic stimulation were not performed. IMPRESSION: Normal electroencephalogram, awake and asleep. There are no focal lateralizing or epileptiform features. Thana Farr, MD Neurology [phone] 01/13/2020, 4:59 PM   CT Head Wo Contrast  Result Date: 01/12/2020 CLINICAL DATA:  Altered mental status with insomnia and agitation EXAM: CT HEAD WITHOUT CONTRAST TECHNIQUE: Contiguous axial images were obtained from the base of the skull through the vertex without intravenous contrast. COMPARISON:  None. FINDINGS: Brain: Ventricles and sulci are normal in size and configuration. There is no intracranial mass, hemorrhage, extra-axial fluid collection, or midline shift. Brain parenchyma appears unremarkable. No evident acute infarct. Vascular: No hyperdense vessel.  No evident vascular calcification. Skull: Bony calvarium appears intact. Sinuses/Orbits: There is mucosal thickening in several ethmoid air cells. There is opacification in a portion of the superior left sphenoid sinus. Visualized orbits appear symmetric bilaterally. Other: Mastoid air cells are clear. IMPRESSION: Areas of paranasal sinus disease, primarily in the left sphenoid sinus region. Study otherwise unremarkable. Electronically Signed   By: Bretta Bang III M.D.   On: 01/12/2020 12:22      Scheduled Meds: .  Chlorhexidine Gluconate Cloth  6 each Topical Daily  . sodium chloride flush  3 mL Intravenous Q12H   Continuous Infusions: . sodium chloride 75 mL/hr at 01/13/20 1300     LOS: 0 days      Calvert Cantor, MD [facility] Pager: [email or web address] 01/13/2020, 7:15 PM

## 2020-01-13 NOTE — ED Notes (Signed)
Mitts bilateral applied to pt

## 2020-01-13 NOTE — ED Notes (Signed)
Patient is resting comfortably. Bilateral wrist restraints remain on pt. +Radialpulses w/ skin warm, dry, and intact, and color WNL.Able to get 2 fingers in between restraints and pts skin. No other pt needs identified. This tech reminds at beside as 1:1 sitter.

## 2020-01-13 NOTE — Progress Notes (Signed)
Patient is still disoriented x3, but calm and cooperative with staff at this time. Sitter still present in room. Restraints were removed around 0930 when patient arrived to unit, no longer violent. Patient will occasionally get agitated, but can easily calm patient with conversation.  Gery Pray

## 2020-01-13 NOTE — Consult Note (Signed)
Reason for Consult:AMS Requesting Physician: Rizwan  CC: AMS  I have been asked by Dr. Butler Denmark to see this patient in consultation for AMS.  HPI: Marissa Walsh is an 39 y.o. female who is altered and unable to provide history therefore all history obtained from the chart.  Patient with a history of depression, allergies, asthma who presents with abnormal behavior.  Per husband patient had not slept for 2-3 days prior to presentation.  She became agitated and restless.  Kept saying she was dying and apologizing.  Throughout ED stay was agitated, biting, kicking, screaming and taking out IV's.  Psychiatry evaluated patient and felt that other etiologies other than psychiatric ones should be ruled out.     Past Medical History:  Diagnosis Date  . Asthma   . Bacterial vaginosis   . Ovarian cyst   . Vulvar dermatitis     History reviewed. No pertinent surgical history.  Family History  Problem Relation Age of Onset  . Hyperlipidemia Mother   . Hypertension Mother   . Diabetes Mother   . Cancer Father 33       colon cancer    Social History:  reports that she has never smoked. She has never used smokeless tobacco. She reports that she does not drink alcohol or use drugs.  Allergies  Allergen Reactions  . Shellfish Allergy Itching  . Alcohol-Sulfur [Sulfur] Itching    Medications:  I have reviewed the patient's current medications. Prior to Admission:  Medications Prior to Admission  Medication Sig Dispense Refill Last Dose  . ibuprofen (ADVIL,MOTRIN) 600 MG tablet Take 1 tablet (600 mg total) by mouth every 8 (eight) hours as needed. 30 tablet 0 Unknown at PRN   Scheduled: . sodium chloride flush  3 mL Intravenous Q12H    ROS: Unable to obtain due to mental status  Physical Examination: Blood pressure (!) 148/92, pulse (!) 118, temperature 98.1 F (36.7 C), temperature source Oral, resp. rate (!) 22, height 5\' 2"  (1.575 m), weight 63 kg, last menstrual period 01/04/2020, SpO2  98 %.  HEENT-  Normocephalic, no lesions, without obvious abnormality.  Normal external eye and conjunctiva.  Normal TM's bilaterally.  Normal auditory canals and external ears. Normal external nose, mucus membranes and septum.  Normal pharynx. Cardiovascular- S1, S2 normal, pulses palpable throughout   Lungs- chest clear, no wheezing, rales, normal symmetric air entry Abdomen- soft, non-tender; bowel sounds normal; no masses,  no organomegaly Extremities- no edema Lymph-no adenopathy palpable Musculoskeletal-no joint tenderness, deformity or swelling Skin-warm and dry, no hyperpigmentation, vitiligo, or suspicious lesions  Neurological Examination   Mental Status: Lying in bed with eyes closed.  Able to communicate in 03/05/2020.  Oriented to name but reports that she is at home.  Follows commands.  Speech minimal but fluent.   Cranial Nerves: II: Discs flat bilaterally; Blinks to bilateral confrontation.  Pupils equal, round, reactive to light and accommodation III,IV, VI: ptosis not present, extra-ocular motions intact bilaterally V,VII: smile symmetric, facial light touch sensation normal bilaterally VIII: hearing normal bilaterally IX,X: gag reflex present XI: bilateral shoulder shrug XII: midline tongue extension Motor: Right : Upper extremity   5/5    Left:     Upper extremity   5/5  Lower extremity   5/5     Lower extremity   5/5  Intermittent shaking of the lower extremities noted.   Sensory: Pinprick and light touch intact throughout, bilaterally Deep Tendon Reflexes: Symmetric throughout Plantars: Right: downgoing   Left: downgoing  Cerebellar: Normal finger-to-nose and normal heel-to-shin testing bilaterally.  Gait: not tested due to safety concerns    Laboratory Studies:   Basic Metabolic Panel: Recent Labs  Lab 01/12/20 1058 01/13/20 0412  NA 140 136  K 3.5 3.4*  CL 103 103  CO2 26 23  GLUCOSE 109* 124*  BUN 14 8  CREATININE 0.50 0.45  CALCIUM 8.9 7.7*     Liver Function Tests: Recent Labs  Lab 01/12/20 1058  AST 21  ALT 20  ALKPHOS 50  BILITOT 0.8  PROT 8.0  ALBUMIN 4.4   No results for input(s): LIPASE, AMYLASE in the last 168 hours. No results for input(s): AMMONIA in the last 168 hours.  CBC: Recent Labs  Lab 01/12/20 1058 01/13/20 0412  WBC 10.6* 9.7  HGB 12.5 10.1*  HCT 38.0 31.9*  MCV 65.4* 68.8*  PLT 273 196    Cardiac Enzymes: No results for input(s): CKTOTAL, CKMB, CKMBINDEX, TROPONINI in the last 168 hours.  BNP: Invalid input(s): POCBNP  CBG: No results for input(s): GLUCAP in the last 168 hours.  Microbiology: Results for orders placed or performed during the hospital encounter of 01/12/20  Respiratory Panel by RT PCR (Flu A&B, Covid) - Nasopharyngeal Swab     Status: None   Collection Time: 01/12/20  4:06 PM   Specimen: Nasopharyngeal Swab  Result Value Ref Range Status   SARS Coronavirus 2 by RT PCR NEGATIVE NEGATIVE Final    Comment: (NOTE) SARS-CoV-2 target nucleic acids are NOT DETECTED. The SARS-CoV-2 RNA is generally detectable in upper respiratoy specimens during the acute phase of infection. The lowest concentration of SARS-CoV-2 viral copies this assay can detect is 131 copies/mL. A negative result does not preclude SARS-Cov-2 infection and should not be used as the sole basis for treatment or other patient management decisions. A negative result may occur with  improper specimen collection/handling, submission of specimen other than nasopharyngeal swab, presence of viral mutation(s) within the areas targeted by this assay, and inadequate number of viral copies (<131 copies/mL). A negative result must be combined with clinical observations, patient history, and epidemiological information. The expected result is Negative. Fact Sheet for Patients:  PinkCheek.be Fact Sheet for Healthcare Providers:  GravelBags.it This test is  not yet ap proved or cleared by the [place] FDA and  has been authorized for detection and/or diagnosis of SARS-CoV-2 by FDA under an Emergency Use Authorization (EUA). This EUA will remain  in effect (meaning this test can be used) for the duration of the COVID-19 declaration under Section 564(b)(1) of the Act, 21 U.S.C. section 360bbb-3(b)(1), unless the authorization is terminated or revoked sooner.    Influenza A by PCR NEGATIVE NEGATIVE Final   Influenza B by PCR NEGATIVE NEGATIVE Final    Comment: (NOTE) The Xpert Xpress SARS-CoV-2/FLU/RSV assay is intended as an aid in  the diagnosis of influenza from Nasopharyngeal swab specimens and  should not be used as a sole basis for treatment. Nasal washings and  aspirates are unacceptable for Xpert Xpress SARS-CoV-2/FLU/RSV  testing. Fact Sheet for Patients: PinkCheek.be Fact Sheet for Healthcare Providers: GravelBags.it This test is not yet approved or cleared by the [place] FDA and  has been authorized for detection and/or diagnosis of SARS-CoV-2 by  FDA under an Emergency Use Authorization (EUA). This EUA will remain  in effect (meaning this test can be used) for the duration of the  Covid-19 declaration under Section 564(b)(1) of the Act, 21  U.S.C. section 360bbb-3(b)(1), unless the  authorization is  terminated or revoked. Performed at [facility], [address] [place], [place] [address]   CULTURE, BLOOD (ROUTINE X 2) w Reflex to ID Panel     Status: None (Preliminary result)   Collection Time: 01/12/20  5:47 PM   Specimen: BLOOD  Result Value Ref Range Status   Specimen Description BLOOD RIGHT ANTECUBITAL  Final   Special Requests   Final    BOTTLES DRAWN AEROBIC AND ANAEROBIC Blood Culture adequate volume   Culture   Final    NO GROWTH < 24 HOURS Performed at [facility], [address], [place], [place] [address]    Report  Status PENDING  Incomplete  CULTURE, BLOOD (ROUTINE X 2) w Reflex to ID Panel     Status: None (Preliminary result)   Collection Time: 01/12/20  5:50 PM   Specimen: BLOOD  Result Value Ref Range Status   Specimen Description BLOOD BLOOD LEFT FOREARM  Final   Special Requests   Final    BOTTLES DRAWN AEROBIC AND ANAEROBIC Blood Culture results may not be optimal due to an inadequate volume of blood received in culture bottles   Culture   Final    NO GROWTH < 24 HOURS Performed at [facility], [address] [place], [place] [address]    Report Status PENDING  Incomplete    Coagulation Studies: No results for input(s): LABPROT, INR in the last 72 hours.  Urinalysis:  Recent Labs  Lab 01/12/20 1606  COLORURINE YELLOW*  LABSPEC 1.021  PHURINE 6.0  GLUCOSEU NEGATIVE  HGBUR NEGATIVE  BILIRUBINUR NEGATIVE  KETONESUR 20*  PROTEINUR NEGATIVE  NITRITE NEGATIVE  LEUKOCYTESUR NEGATIVE    Lipid Panel:     Component Value Date/Time   CHOL 188 07/11/2016 0950   TRIG 92.0 07/11/2016 0950   HDL 43.70 07/11/2016 0950   CHOLHDL 4 07/11/2016 0950   VLDL 18.4 07/11/2016 0950   LDLCALC 125 (H) 07/11/2016 0950    HgbA1C:  Lab Results  Component Value Date   HGBA1C 5.1 07/11/2016    Urine Drug Screen:      Component Value Date/Time   LABOPIA NONE DETECTED 01/12/2020 1606   COCAINSCRNUR NONE DETECTED 01/12/2020 1606   LABBENZ NONE DETECTED 01/12/2020 1606   AMPHETMU NONE DETECTED 01/12/2020 1606   THCU NONE DETECTED 01/12/2020 1606   LABBARB NONE DETECTED 01/12/2020 1606    Alcohol Level:  Recent Labs  Lab 01/12/20 1058  ETH <10     Imaging: CT Head Wo Contrast  Result Date: 01/12/2020 CLINICAL DATA:  Altered mental status with insomnia and agitation EXAM: CT HEAD WITHOUT CONTRAST TECHNIQUE: Contiguous axial images were obtained from the base of the skull through the vertex without intravenous contrast. COMPARISON:  None. FINDINGS: Brain: Ventricles and  sulci are normal in size and configuration. There is no intracranial mass, hemorrhage, extra-axial fluid collection, or midline shift. Brain parenchyma appears unremarkable. No evident acute infarct. Vascular: No hyperdense vessel.  No evident vascular calcification. Skull: Bony calvarium appears intact. Sinuses/Orbits: There is mucosal thickening in several ethmoid air cells. There is opacification in a portion of the superior left sphenoid sinus. Visualized orbits appear symmetric bilaterally. Other: Mastoid air cells are clear. IMPRESSION: Areas of paranasal sinus disease, primarily in the left sphenoid sinus region. Study otherwise unremarkable. Electronically Signed   By: Bretta Bang III M.D.   On: 01/12/2020 12:22     Assessment/Plan: 39 y.o. female who is altered and unable to provide history therefore  all history obtained from the chart.  Patient with a history of depression, allergies, asthma who presents with abnormal behavior.  Per husband patient had not slept for 2-3 days prior to presentation.  She became agitated and restless.  Kept saying she was dying and apologizing.  Throughout ED stay was agitated, biting, kicking, screaming and taking out IV's.  Psychiatry evaluated patient and felt that other etiologies other than psychiatric ones should be ruled out.  Lab work unremarkable including TSH, RPR, UDS.  Last Calcium was low.  Head CT personally reviewed and shows no acute changes.  Patient spiked a fever overnight.  LP was performed and was unremarkable with no white cells, normal protein and glucose.  Etiology for altered mental status remains unclear.  Although patient some improved, is not back to baseline.  Suspect psychiatric etiology but will perform further work up.    Recommendations: 1. MRI of the brain with and without contrast.  Patient may require sedation to perform 2. Calcium supplementation if repeat level remains low 3. EEG 4. ACTH, B12, vitamin D, B1   Thana Farr, MD Neurology [phone] 01/13/2020, 1:09 PM

## 2020-01-13 NOTE — Progress Notes (Signed)
eeg completed ° °

## 2020-01-13 NOTE — ED Notes (Signed)
Pt sleeping at this time. Ankle restraints loosened, 1:1 sitter present. Will continue to monitor closely and will attempt to transition pt off of restraints as tolerated

## 2020-01-13 NOTE — ED Notes (Signed)
+  Radial and +pedal pulses w/ skin warm and dry and color WNL. Able to get 2 fingers in between restraints and pts skin.

## 2020-01-13 NOTE — ED Notes (Signed)
Wrist restraints discontinued, soft mitts applied. One mitt removed while pt eats. 1:1 sitter at bedside

## 2020-01-13 NOTE — ED Notes (Signed)
Pt repeatedly reports needing to urinate. Pt assisted to bedpan several times. Pt is friendly with this RN, but becomes agitated when this RN is not at bedside. Pt repeatedly thrusts hips in air and reports she "has a baby that is coming out."  Pt appears restless, is minimally responsive to redirection

## 2020-01-13 NOTE — ED Notes (Addendum)
Pt attempting to pull mitts off.  Pt screaming she wants to leave.  Trying to get out of bed and leave.  Kicked leg in air toward nurse.  Informed patient if she kicks or hits any staff again today she will need go back in the restraints and it will not be tolerated.  Pt states "take these off me, I can leave if I want to".  Spoke with admitting doctor r/t escalating behavior.  Soft wrist restraints ordered.  Pt appears to remain confused. Pt yelling and screaming in room.  Dee RN at bedside to assist. Ophelia Charter also remains at bedside to assist.

## 2020-01-13 NOTE — ED Notes (Signed)
Dr Butler Denmark at bedside. Mandrin interpretor being used

## 2020-01-13 NOTE — ED Notes (Signed)
Bilateral ankle restraints removed. Bilateral wrist restraints remain on pt. +Radial pulses w/ skin warm and dry and color WNL.Able to get 2 fingers in between restraints and pts skin.

## 2020-01-13 NOTE — ED Notes (Addendum)
Pt continues to repeatedly reports urge to pee. This tech offered bedpad. Pt replies, "No I want to get up. I need to go home."  Pt was able to urinate on the bedpad after multiple tries. Pericare was provided. Pt started to get agitated again.   This tech offered pt water. Pt easily falls back to sleep. Bilateral wrist restraints still in place. Bilateral ankle restraints remain loosened but in place as precaution. + Radial and +pedal pulses w/ skin warm and dry and color WNL. No other needs identified. This tech remains 1:1.

## 2020-01-13 NOTE — Progress Notes (Signed)
Pt. asleep.  CH consulted w/RN on ICU as follow-up from visit in ED yesterday; pt. reportedly calm and tired; initially exhibited some anxiety when brought to ICU but seems to have calmed down, acc. to RN.  CH will attempt follow-up visit when pt. is awake.

## 2020-01-13 NOTE — ED Notes (Signed)
Monitor placed on pt.  Pt does not currently have mitts or restraints on pt.  Sitter at bedside.

## 2020-01-14 ENCOUNTER — Inpatient Hospital Stay: Payer: BLUE CROSS/BLUE SHIELD

## 2020-01-14 ENCOUNTER — Encounter: Payer: Self-pay | Admitting: Internal Medicine

## 2020-01-14 DIAGNOSIS — D509 Iron deficiency anemia, unspecified: Secondary | ICD-10-CM | POA: Diagnosis not present

## 2020-01-14 DIAGNOSIS — R410 Disorientation, unspecified: Secondary | ICD-10-CM | POA: Diagnosis not present

## 2020-01-14 DIAGNOSIS — E559 Vitamin D deficiency, unspecified: Secondary | ICD-10-CM | POA: Diagnosis not present

## 2020-01-14 DIAGNOSIS — D569 Thalassemia, unspecified: Secondary | ICD-10-CM | POA: Diagnosis not present

## 2020-01-14 LAB — HIV ANTIBODY (ROUTINE TESTING W REFLEX): HIV Screen 4th Generation wRfx: NONREACTIVE

## 2020-01-14 LAB — VITAMIN D 25 HYDROXY (VIT D DEFICIENCY, FRACTURES): Vit D, 25-Hydroxy: 27.38 ng/mL — ABNORMAL LOW (ref 30–100)

## 2020-01-14 LAB — VDRL, CSF: VDRL Quant, CSF: NONREACTIVE

## 2020-01-14 LAB — VITAMIN B12: Vitamin B-12: 352 pg/mL (ref 180–914)

## 2020-01-14 MED ORDER — LORAZEPAM 2 MG/ML IJ SOLN: 2.0000 mg | Freq: Once | INTRAMUSCULAR | Status: DC

## 2020-01-14 MED ORDER — QUETIAPINE FUMARATE 25 MG PO TABS
25.0000 mg | ORAL_TABLET | Freq: Every day | ORAL | Status: DC
  Administered 2020-01-14: 25 mg via ORAL

## 2020-01-14 MED ORDER — LORAZEPAM 2 MG/ML IJ SOLN
1.0000 mg | INTRAMUSCULAR | Status: DC | PRN
  Administered 2020-01-14 – 2020-01-16 (×3): 1 mg via INTRAVENOUS
  Filled 2020-01-14 (×3): qty 1

## 2020-01-14 MED ORDER — QUETIAPINE FUMARATE 25 MG PO TABS: 25.0000 mg | ORAL_TABLET | Freq: Every day | ORAL | Status: DC

## 2020-01-14 MED ORDER — GADOBUTROL 1 MMOL/ML IV SOLN
6.0000 mL | Freq: Once | INTRAVENOUS | Status: AC | PRN
  Administered 2020-01-14: 15:00:00 6 mL via INTRAVENOUS

## 2020-01-14 MED ORDER — LORAZEPAM 2 MG/ML IJ SOLN
INTRAMUSCULAR | Status: AC
  Administered 2020-01-14: 2 mg
  Filled 2020-01-14: qty 1

## 2020-01-14 NOTE — Progress Notes (Signed)
Subjective: Patient agitated overnight.  This morning calm and sitting up in bed.  Communicating in [place].    Objective: Current vital signs: BP (!) 142/98   Pulse (!) 101   Temp 97.8 F (36.6 C) (Oral)   Resp (!) 22   Ht 5\' 2"  (1.575 m)   Wt 63 kg   LMP 01/04/2020 (Approximate)   SpO2 100%   BMI 25.40 kg/m  Vital signs in last 24 hours: Temp:  [97.8 F (36.6 C)-98.1 F (36.7 C)] 97.8 F (36.6 C) (04/15 0200) Pulse Rate:  [81-143] 101 (04/15 0400) Resp:  [17-41] 22 (04/15 0400) BP: (113-151)/(50-109) 142/98 (04/15 0400) SpO2:  [93 %-100 %] 100 % (04/15 0400)  Intake/Output from previous day: 04/14 0701 - 04/15 0700 In: 1706.2 [P.O.:360; I.V.:1346.2] Out: -  Intake/Output this shift: No intake/output data recorded. Nutritional status:  Diet Order            Diet regular Room service appropriate? Yes; Fluid consistency: Thin  Diet effective now              Neurologic Exam: Mental Status: Alert.  Speech fluent.  Follows commands. Cranial Nerves: II: Visual fields grossly normal, pupils equal, round, reactive to light and accommodation III,IV, VI: ptosis not present, extra-ocular motions intact bilaterally V,VII: smile symmetric, facial light touch sensation normal bilaterally VIII: hearing normal bilaterally IX,X: gag reflex present XI: bilateral shoulder shrug XII: midline tongue extension Motor: 5/5 throughout  Lab Results: Basic Metabolic Panel: Recent Labs  Lab 01/12/20 1058 01/13/20 0412  NA 140 136  K 3.5 3.4*  CL 103 103  CO2 26 23  GLUCOSE 109* 124*  BUN 14 8  CREATININE 0.50 0.45  CALCIUM 8.9 7.7*    Liver Function Tests: Recent Labs  Lab 01/12/20 1058  AST 21  ALT 20  ALKPHOS 50  BILITOT 0.8  PROT 8.0  ALBUMIN 4.4   No results for input(s): LIPASE, AMYLASE in the last 168 hours. No results for input(s): AMMONIA in the last 168 hours.  CBC: Recent Labs  Lab 01/12/20 1058 01/13/20 0412  WBC 10.6* 9.7  HGB 12.5 10.1*   HCT 38.0 31.9*  MCV 65.4* 68.8*  PLT 273 196    Cardiac Enzymes: No results for input(s): CKTOTAL, CKMB, CKMBINDEX, TROPONINI in the last 168 hours.  Lipid Panel: No results for input(s): CHOL, TRIG, HDL, CHOLHDL, VLDL, LDLCALC in the last 168 hours.  CBG: No results for input(s): GLUCAP in the last 168 hours.  Microbiology: Results for orders placed or performed during the hospital encounter of 01/12/20  Respiratory Panel by RT PCR (Flu A&B, Covid) - Nasopharyngeal Swab     Status: None   Collection Time: 01/12/20  4:06 PM   Specimen: Nasopharyngeal Swab  Result Value Ref Range Status   SARS Coronavirus 2 by RT PCR NEGATIVE NEGATIVE Final    Comment: (NOTE) SARS-CoV-2 target nucleic acids are NOT DETECTED. The SARS-CoV-2 RNA is generally detectable in upper respiratoy specimens during the acute phase of infection. The lowest concentration of SARS-CoV-2 viral copies this assay can detect is 131 copies/mL. A negative result does not preclude SARS-Cov-2 infection and should not be used as the sole basis for treatment or other patient management decisions. A negative result may occur with  improper specimen collection/handling, submission of specimen other than nasopharyngeal swab, presence of viral mutation(s) within the areas targeted by this assay, and inadequate number of viral copies (<131 copies/mL). A negative result must be combined with clinical observations,  patient history, and epidemiological information. The expected result is Negative. Fact Sheet for Patients:  https://www.moore.com/ Fact Sheet for Healthcare Providers:  https://www.young.biz/ This test is not yet ap proved or cleared by the [place] FDA and  has been authorized for detection and/or diagnosis of SARS-CoV-2 by FDA under an Emergency Use Authorization (EUA). This EUA will remain  in effect (meaning this test can be used) for the duration of the COVID-19  declaration under Section 564(b)(1) of the Act, 21 U.S.C. section 360bbb-3(b)(1), unless the authorization is terminated or revoked sooner.    Influenza A by PCR NEGATIVE NEGATIVE Final   Influenza B by PCR NEGATIVE NEGATIVE Final    Comment: (NOTE) The Xpert Xpress SARS-CoV-2/FLU/RSV assay is intended as an aid in  the diagnosis of influenza from Nasopharyngeal swab specimens and  should not be used as a sole basis for treatment. Nasal washings and  aspirates are unacceptable for Xpert Xpress SARS-CoV-2/FLU/RSV  testing. Fact Sheet for Patients: https://www.moore.com/ Fact Sheet for Healthcare Providers: https://www.young.biz/ This test is not yet approved or cleared by the [place] FDA and  has been authorized for detection and/or diagnosis of SARS-CoV-2 by  FDA under an Emergency Use Authorization (EUA). This EUA will remain  in effect (meaning this test can be used) for the duration of the  Covid-19 declaration under Section 564(b)(1) of the Act, 21  U.S.C. section 360bbb-3(b)(1), unless the authorization is  terminated or revoked. Performed at [facility], [address] [place], [place] [address]   CULTURE, BLOOD (ROUTINE X 2) w Reflex to ID Panel     Status: None (Preliminary result)   Collection Time: 01/12/20  5:47 PM   Specimen: BLOOD  Result Value Ref Range Status   Specimen Description BLOOD RIGHT ANTECUBITAL  Final   Special Requests   Final    BOTTLES DRAWN AEROBIC AND ANAEROBIC Blood Culture adequate volume   Culture   Final    NO GROWTH 2 DAYS Performed at [facility], [address], [place], [place] [address]    Report Status PENDING  Incomplete  CULTURE, BLOOD (ROUTINE X 2) w Reflex to ID Panel     Status: None (Preliminary result)   Collection Time: 01/12/20  5:50 PM   Specimen: BLOOD  Result Value Ref Range Status   Specimen Description BLOOD BLOOD LEFT FOREARM  Final   Special Requests    Final    BOTTLES DRAWN AEROBIC AND ANAEROBIC Blood Culture results may not be optimal due to an inadequate volume of blood received in culture bottles   Culture   Final    NO GROWTH 2 DAYS Performed at [facility], [address] [place], [place] [address]    Report Status PENDING  Incomplete    Coagulation Studies: No results for input(s): LABPROT, INR in the last 72 hours.  Imaging: EEG  Result Date: 01/13/2020 Thana Farr, MD     01/13/2020  5:01 PM ELECTROENCEPHALOGRAM REPORT Patient: [name]       Room #: IC10A-AA EEG No. ID: [id number] Age: 40 y.o.        Sex: female Requesting Physician: Rizwan Report Date:  01/13/2020       Interpreting Physician: Thana Farr History: Allien Melberg is an 39 y.o. female with altered mental status Medications: Ativan, Haldol Conditions of Recording:  This is a 21 channel routine scalp EEG performed with bipolar and monopolar montages arranged in accordance to the international 10/20 system of electrode placement. One channel  was dedicated to EKG recording. The patient is in the awake, drowsy and asleep states. Description:  The waking background activity consists of a low voltage, symmetrical, fairly well organized, 9 Hz alpha activity, seen from the parieto-occipital and posterior temporal regions.  Low voltage fast activity, poorly organized, is seen anteriorly and is at times superimposed on more posterior regions.  A mixture of theta and alpha rhythms are seen from the central and temporal regions. The patient drowses with slowing to irregular, low voltage theta and beta activity.  The patient goes in to a light sleep with symmetrical sleep spindles, vertex central sharp transients and irregular slow activity. The patient has an episode of leg shaking with no change in the background noted.  No epileptiform activity is noted.  Hyperventilation and intermittent photic stimulation were not performed. IMPRESSION: Normal electroencephalogram, awake  and asleep. There are no focal lateralizing or epileptiform features. Thana Farr, MD Neurology [phone] 01/13/2020, 4:59 PM   CT Head Wo Contrast  Result Date: 01/12/2020 CLINICAL DATA:  Altered mental status with insomnia and agitation EXAM: CT HEAD WITHOUT CONTRAST TECHNIQUE: Contiguous axial images were obtained from the base of the skull through the vertex without intravenous contrast. COMPARISON:  None. FINDINGS: Brain: Ventricles and sulci are normal in size and configuration. There is no intracranial mass, hemorrhage, extra-axial fluid collection, or midline shift. Brain parenchyma appears unremarkable. No evident acute infarct. Vascular: No hyperdense vessel.  No evident vascular calcification. Skull: Bony calvarium appears intact. Sinuses/Orbits: There is mucosal thickening in several ethmoid air cells. There is opacification in a portion of the superior left sphenoid sinus. Visualized orbits appear symmetric bilaterally. Other: Mastoid air cells are clear. IMPRESSION: Areas of paranasal sinus disease, primarily in the left sphenoid sinus region. Study otherwise unremarkable. Electronically Signed   By: Bretta Bang III M.D.   On: 01/12/2020 12:22    Medications:  I have reviewed the patient's current medications. Scheduled: . Chlorhexidine Gluconate Cloth  6 each Topical Daily  . LORazepam  2 mg Intravenous Once  . QUEtiapine  25 mg Oral QHS  . sodium chloride flush  3 mL Intravenous Q12H    Assessment/Plan: 39 y.o. female with a history of depression, allergies, asthmawho presents with abnormal behavior.  Patient agitated, biting, kicking, screaming and taking out IV's.  Psychiatry evaluated patient and felt that other etiologies other than psychiatric ones should be ruled out.  Lab work unremarkable including TSH, RPR, UDS.  Last Calcium was low.  Head CT personally reviewed and shows no acute changes.  Patient spiked a fever.  LP was performed and was unremarkable with no  white cells, normal protein and glucose.  EEG unremarkable.  Etiology for altered mental status remains unclear.  Although patient some improved, is not back to baseline.  Suspect psychiatric etiology but will perform further work up.    Recommendations: 1. MRI of the brain and further lab work pending.     LOS: 1 day   Thana Farr, MD Neurology [phone] 01/14/2020  10:03 AM

## 2020-01-14 NOTE — Progress Notes (Addendum)
PROGRESS NOTE    Marissa Walsh   WUX:[id number]  DOB: 23-Aug-1981  DOA: 01/12/2020 PCP: Allegra Grana, FNP   Brief Narrative:  Marissa Walsh is a 39 y/o F w/ PMH of depression, allergies, asthma who presents w/ confusion and severe agitation. She had not slept in 2 and a half days. She was noted to be aggressive and yelling in the ED and was sedated. In the ED she was noted to have a temp of 101. She was admitted for a medical work up for her mental status change.     Subjective: Having breakfast but appears sleepy. Asking to go home. No offering much information when asked questions.     Assessment & Plan:   Active Problems:     Acute metabolic encephalopathy - still agitated this  AM, kicking staff and pulling out IVs- per psych, she was much less agitated when he evaluated her later in the day - appreciate neuro eval - medical work up including and LP and EEG is unrevealing as to the cause of her confusion - no recurrence of the fever - she was apparently taking some odd supplement at home called TH90 which she bought off the Internet which Dr Burgess Estelle (psych) thinks may have contributed - will follow without addition medications for now- keep sitter at bedside - 4/15> apparently very agitated, hallucinations and delusions mentioned RN's note- given IV ativan as Seroquel did not help - will not d/c home yet- awaiting further opinion from psych  Thalassemia - never needed a blood transfusion  Addendum: called by RN and spoke with family with interpretor over phone in presence of RN. I answered all questions. Towards the end of the conversation, the patient states vision gets blurred when she has these "episodes". Per RN and interpretor, the patient is getting fidgety and restless. She has asked for something for anxiety. RN states she has been this way all day. Last night she became very agitated and needed 2 mg of Ativan to sleep. ? If she is having manic episode- will order Ativan 1 mg  Q4 hrs PRN. Hope to have psych reassess in AM  Time spent in minutes: 35 DVT prophylaxis: SCDs Code Status: Full code Family Communication: Husband via interpretor  Disposition Plan: follow over night in hospital again await psych eval again tomorrow Consultants:   Psych  Neuro  Procedures:   EEG  LP Antimicrobials:  Anti-infectives (From admission, onward)   Start     Dose/Rate Route Frequency Ordered Stop   01/12/20 1800  acyclovir (ZOVIRAX) 630 mg in dextrose 5 % 100 mL IVPB     10 mg/kg  63 kg 112.6 mL/hr over 60 Minutes Intravenous  Once 01/12/20 1721 01/12/20 1904   01/12/20 1730  cefTRIAXone (ROCEPHIN) 2 g in sodium chloride 0.9 % 100 mL IVPB     2 g 200 mL/hr over 30 Minutes Intravenous  Once 01/12/20 1721 01/12/20 1828   01/12/20 1730  vancomycin (VANCOCIN) IVPB 1000 mg/200 mL premix     1,000 mg 200 mL/hr over 60 Minutes Intravenous  Once 01/12/20 1721 01/12/20 1904       Objective: Vitals:   01/14/20 0100 01/14/20 0200 01/14/20 0300 01/14/20 0400  BP: (!) 140/98 (!) 121/93 136/90 (!) 142/98  Pulse: (!) 108 95 97 (!) 101  Resp: (!) 23 (!) 21 18 (!) 22  Temp:  97.8 F (36.6 C)    TempSrc:  Oral    SpO2: 100% 99% 99% 100%  Weight:  Height:        Intake/Output Summary (Last 24 hours) at 01/14/2020 1000 Last data filed at 01/14/2020 0600 Gross per 24 hour  Intake 1704.96 ml  Output --  Net 1704.96 ml   Filed Weights   01/12/20 1055  Weight: 63 kg    Examination: General exam: Appears comfortable - sleepy (in AM) HEENT: PERRLA, oral mucosa moist, no sclera icterus or thrush Respiratory system: Clear to auscultation. Respiratory effort normal. Cardiovascular system: S1 & S2 heard,  No murmurs  Gastrointestinal system: Abdomen soft, non-tender, nondistended. Normal bowel sounds   Central nervous system: Alert and oriented. No focal neurological deficits. Extremities: No cyanosis, clubbing or edema Skin: No rashes or ulcers Psychiatry:  Mood &  affect appropriate.    Data Reviewed: I have personally reviewed following labs and imaging studies  CBC: Recent Labs  Lab 01/12/20 1058 01/13/20 0412  WBC 10.6* 9.7  HGB 12.5 10.1*  HCT 38.0 31.9*  MCV 65.4* 68.8*  PLT 273 196   Basic Metabolic Panel: Recent Labs  Lab 01/12/20 1058 01/13/20 0412  NA 140 136  K 3.5 3.4*  CL 103 103  CO2 26 23  GLUCOSE 109* 124*  BUN 14 8  CREATININE 0.50 0.45  CALCIUM 8.9 7.7*   GFR: Estimated Creatinine Clearance: 82.4 mL/min (by C-G formula based on SCr of 0.45 mg/dL). Liver Function Tests: Recent Labs  Lab 01/12/20 1058  AST 21  ALT 20  ALKPHOS 50  BILITOT 0.8  PROT 8.0  ALBUMIN 4.4   No results for input(s): LIPASE, AMYLASE in the last 168 hours. No results for input(s): AMMONIA in the last 168 hours. Coagulation Profile: No results for input(s): INR, PROTIME in the last 168 hours. Cardiac Enzymes: No results for input(s): CKTOTAL, CKMB, CKMBINDEX, TROPONINI in the last 168 hours. BNP (last 3 results) No results for input(s): PROBNP in the last 8760 hours. HbA1C: No results for input(s): HGBA1C in the last 72 hours. CBG: No results for input(s): GLUCAP in the last 168 hours. Lipid Profile: No results for input(s): CHOL, HDL, LDLCALC, TRIG, CHOLHDL, LDLDIRECT in the last 72 hours. Thyroid Function Tests: Recent Labs    01/12/20 1058  TSH 1.019  FREET4 1.12   Anemia Panel: Recent Labs    01/12/20 1058 01/13/20 0412  FERRITIN  --  31  TIBC 420  --   IRON 70  --    Urine analysis:    Component Value Date/Time   COLORURINE YELLOW (A) 01/12/2020 1606   APPEARANCEUR CLEAR (A) 01/12/2020 1606   LABSPEC 1.021 01/12/2020 1606   PHURINE 6.0 01/12/2020 1606   GLUCOSEU NEGATIVE 01/12/2020 1606   HGBUR NEGATIVE 01/12/2020 1606   BILIRUBINUR NEGATIVE 01/12/2020 1606   BILIRUBINUR neg 08/19/2018 0937   KETONESUR 20 (A) 01/12/2020 1606   PROTEINUR NEGATIVE 01/12/2020 1606   UROBILINOGEN 0.2 08/19/2018 0937    NITRITE NEGATIVE 01/12/2020 1606   LEUKOCYTESUR NEGATIVE 01/12/2020 1606   Sepsis Labs: @LABRCNTIP (procalcitonin:4,lacticidven:4) ) Recent Results (from the past 240 hour(s))  Respiratory Panel by RT PCR (Flu A&B, Covid) - Nasopharyngeal Swab     Status: None   Collection Time: 01/12/20  4:06 PM   Specimen: Nasopharyngeal Swab  Result Value Ref Range Status   SARS Coronavirus 2 by RT PCR NEGATIVE NEGATIVE Final    Comment: (NOTE) SARS-CoV-2 target nucleic acids are NOT DETECTED. The SARS-CoV-2 RNA is generally detectable in upper respiratoy specimens during the acute phase of infection. The lowest concentration of SARS-CoV-2 viral copies  this assay can detect is 131 copies/mL. A negative result does not preclude SARS-Cov-2 infection and should not be used as the sole basis for treatment or other patient management decisions. A negative result may occur with  improper specimen collection/handling, submission of specimen other than nasopharyngeal swab, presence of viral mutation(s) within the areas targeted by this assay, and inadequate number of viral copies (<131 copies/mL). A negative result must be combined with clinical observations, patient history, and epidemiological information. The expected result is Negative. Fact Sheet for Patients:  PinkCheek.be Fact Sheet for Healthcare Providers:  GravelBags.it This test is not yet ap proved or cleared by the [place] FDA and  has been authorized for detection and/or diagnosis of SARS-CoV-2 by FDA under an Emergency Use Authorization (EUA). This EUA will remain  in effect (meaning this test can be used) for the duration of the COVID-19 declaration under Section 564(b)(1) of the Act, 21 U.S.C. section 360bbb-3(b)(1), unless the authorization is terminated or revoked sooner.    Influenza A by PCR NEGATIVE NEGATIVE Final   Influenza B by PCR NEGATIVE NEGATIVE Final     Comment: (NOTE) The Xpert Xpress SARS-CoV-2/FLU/RSV assay is intended as an aid in  the diagnosis of influenza from Nasopharyngeal swab specimens and  should not be used as a sole basis for treatment. Nasal washings and  aspirates are unacceptable for Xpert Xpress SARS-CoV-2/FLU/RSV  testing. Fact Sheet for Patients: PinkCheek.be Fact Sheet for Healthcare Providers: GravelBags.it This test is not yet approved or cleared by the [place] FDA and  has been authorized for detection and/or diagnosis of SARS-CoV-2 by  FDA under an Emergency Use Authorization (EUA). This EUA will remain  in effect (meaning this test can be used) for the duration of the  Covid-19 declaration under Section 564(b)(1) of the Act, 21  U.S.C. section 360bbb-3(b)(1), unless the authorization is  terminated or revoked. Performed at [facility], [address] [place], [place] [address]   CULTURE, BLOOD (ROUTINE X 2) w Reflex to ID Panel     Status: None (Preliminary result)   Collection Time: 01/12/20  5:47 PM   Specimen: BLOOD  Result Value Ref Range Status   Specimen Description BLOOD RIGHT ANTECUBITAL  Final   Special Requests   Final    BOTTLES DRAWN AEROBIC AND ANAEROBIC Blood Culture adequate volume   Culture   Final    NO GROWTH 2 DAYS Performed at [facility], [address], [place], [place] [address]    Report Status PENDING  Incomplete  CULTURE, BLOOD (ROUTINE X 2) w Reflex to ID Panel     Status: None (Preliminary result)   Collection Time: 01/12/20  5:50 PM   Specimen: BLOOD  Result Value Ref Range Status   Specimen Description BLOOD BLOOD LEFT FOREARM  Final   Special Requests   Final    BOTTLES DRAWN AEROBIC AND ANAEROBIC Blood Culture results may not be optimal due to an inadequate volume of blood received in culture bottles   Culture   Final    NO GROWTH 2 DAYS Performed at [facility], [address], [place], [place] [address]    Report Status PENDING  Incomplete         Radiology Studies: EEG  Result Date: 01/13/2020 Alexis Goodell, MD     01/13/2020  5:01 PM ELECTROENCEPHALOGRAM REPORT Patient: Reinaldo Berber       Room #: IC10A-AA EEG No. ID: [id number] Age: 39 y.o.  Sex: female Requesting Physician: Shakeya Kerkman Report Date:  01/13/2020       Interpreting Physician: Thana Farr History: Taja Pentland is an 39 y.o. female with altered mental status Medications: Ativan, Haldol Conditions of Recording:  This is a 21 channel routine scalp EEG performed with bipolar and monopolar montages arranged in accordance to the international 10/20 system of electrode placement. One channel was dedicated to EKG recording. The patient is in the awake, drowsy and asleep states. Description:  The waking background activity consists of a low voltage, symmetrical, fairly well organized, 9 Hz alpha activity, seen from the parieto-occipital and posterior temporal regions.  Low voltage fast activity, poorly organized, is seen anteriorly and is at times superimposed on more posterior regions.  A mixture of theta and alpha rhythms are seen from the central and temporal regions. The patient drowses with slowing to irregular, low voltage theta and beta activity.  The patient goes in to a light sleep with symmetrical sleep spindles, vertex central sharp transients and irregular slow activity. The patient has an episode of leg shaking with no change in the background noted.  No epileptiform activity is noted.  Hyperventilation and intermittent photic stimulation were not performed. IMPRESSION: Normal electroencephalogram, awake and asleep. There are no focal lateralizing or epileptiform features. Thana Farr, MD Neurology [phone] 01/13/2020, 4:59 PM   CT Head Wo Contrast  Result Date: 01/12/2020 CLINICAL DATA:  Altered mental status with insomnia and agitation EXAM: CT HEAD WITHOUT CONTRAST TECHNIQUE: Contiguous  axial images were obtained from the base of the skull through the vertex without intravenous contrast. COMPARISON:  None. FINDINGS: Brain: Ventricles and sulci are normal in size and configuration. There is no intracranial mass, hemorrhage, extra-axial fluid collection, or midline shift. Brain parenchyma appears unremarkable. No evident acute infarct. Vascular: No hyperdense vessel.  No evident vascular calcification. Skull: Bony calvarium appears intact. Sinuses/Orbits: There is mucosal thickening in several ethmoid air cells. There is opacification in a portion of the superior left sphenoid sinus. Visualized orbits appear symmetric bilaterally. Other: Mastoid air cells are clear. IMPRESSION: Areas of paranasal sinus disease, primarily in the left sphenoid sinus region. Study otherwise unremarkable. Electronically Signed   By: Bretta Bang III M.D.   On: 01/12/2020 12:22      Scheduled Meds: . Chlorhexidine Gluconate Cloth  6 each Topical Daily  . LORazepam  2 mg Intravenous Once  . QUEtiapine  25 mg Oral QHS  . sodium chloride flush  3 mL Intravenous Q12H   Continuous Infusions: . sodium chloride Stopped (01/14/20 0503)     LOS: 1 day      Calvert Cantor, MD [facility] Pager: [email or web address] 01/14/2020, 10:00 AM

## 2020-01-14 NOTE — Progress Notes (Addendum)
ADDENDUM: Patient received Lorazepam as the Seroquel did not help. She ripped off IV and was very difficulty to control. She was finally able to go to sleep after administration of Lorazepam. MRI brain with and without contrast ordered. Will likely require sedation for the test.  Notified by primary RN that patient is more confused, agitated, restless with episodes of hallucination and delusions. Apparently she has not slept going on day #4. Trial of low dose Seroquel was administered with some improvement noted. She is less anxious, restless and not trying to get out of bed. She may need slightly higher dose if indicated because she is severely sleep deprived.   Webb Silversmith, DNP, CCRN, FNP-C [facility] Nurse Practitioner

## 2020-01-14 NOTE — Progress Notes (Signed)
Contact by Dr. Butler Denmark. Explained patient was upset about IVC, her and husband would like to speak with her. Dr. Butler Denmark expressed concern the family was upset and said she would speak with them. I explained if she was unable to go to the room, she could speak with them on the phone in the room.

## 2020-01-14 NOTE — Progress Notes (Signed)
Sent alpha numeric page to Dr. Butler Denmark to please call me. Informed by RN that family would like to speak with MD.RN reported she had requested MD speak with patient and husband.

## 2020-01-15 ENCOUNTER — Ambulatory Visit: Payer: BLUE CROSS/BLUE SHIELD | Admitting: Family

## 2020-01-15 DIAGNOSIS — D509 Iron deficiency anemia, unspecified: Secondary | ICD-10-CM | POA: Diagnosis not present

## 2020-01-15 DIAGNOSIS — E559 Vitamin D deficiency, unspecified: Secondary | ICD-10-CM | POA: Diagnosis not present

## 2020-01-15 DIAGNOSIS — Z0289 Encounter for other administrative examinations: Secondary | ICD-10-CM

## 2020-01-15 DIAGNOSIS — R410 Disorientation, unspecified: Secondary | ICD-10-CM | POA: Diagnosis not present

## 2020-01-15 DIAGNOSIS — D569 Thalassemia, unspecified: Secondary | ICD-10-CM | POA: Diagnosis not present

## 2020-01-15 LAB — ACTH: C206 ACTH: 16.6 pg/mL (ref 7.2–63.3)

## 2020-01-15 MED ORDER — VITAMIN D (ERGOCALCIFEROL) 1.25 MG (50000 UNIT) PO CAPS
50000.0000 [IU] | ORAL_CAPSULE | Freq: Once | ORAL | Status: AC
  Administered 2020-01-15: 15:00:00 50000 [IU] via ORAL
  Filled 2020-01-15: qty 1

## 2020-01-15 NOTE — Consult Note (Signed)
[facility] Face-to-Face Psychiatry Consult   Reason for Consult:  MS change Referring Physician:  ER MD Patient Identification: Marissa Walsh MRN:  [id number] Principal Diagnosis: <principal problem not specified> Diagnosis:  Active Problems:   Confusion   Acute metabolic encephalopathy   Total Time spent with patient: 20 minutes  Subjective today  Marissa Walsh is a 39 y.o. female patient admitted with fluctuating MS  eval mirrors last findings.... She is showing slow steady improvement  Much of the history below has resolved. She is still tired but sits up and converses easily. MS is still not entirely clear although this may be the exhaustion from the last days event as well. It is actually pretty easy to understand her without an interpreter. Similarly her husband can communicate easily.  One explanation may be a supplement she was taking which may have be laced with an unknown substance. She describes TK90 a protein burst she that she has been taking for the past month ([email or web address]). I expect that she has been taking other supplements of unknown quality and validity.   Subjective yesterday:   Marissa Walsh is a 39 y.o. female patient admitted with MS change.For me she varies between screaming and obtunded and unresponsive. She does not match any manic or pyschiatric pathology. Suspect medical encephalopathy and continued work up. Likely infectious etiology.  HPI:  Marissa Walsh a 39 y.o.femalepast medical history as below, who presents to the emergency department with her husbandfor abnormal behavior. Husbandstates the patient has not slept in 2.5days. Patient was reportedly restless and agitated last night and acting abnormally today. He states she is not making suicidal statements, but is saying she is dying, and saying sorry. He denies any major recent life changes, aside from starting to talk about buying a house.Does not take any daily psychiatric medications, has a history of major depressive  episode on 2018.  History obtained with assistance of Mandarin interpreter. First interviewedwith patient alone, and then with husband (and interpreter)for supplementation.   History extremely limited as patient isa difficulthistorian. She seems confused andessentially justrepeats back what the interpretersays to her in Mandarin. She asks repetitively, "who should I look at[interviewer or interpreter]?" And thenseems to become distressed andstates "I can't see anything!" Then looks at her husband and says she can see her husband.  Husband says her behavior is abnormal. No history of same. Husband denies any alcohol or drug use.  Past Psychiatric History: depression. No mania or substance use.  Risk to Self:   Risk to Others:   Prior Inpatient Therapy:   Prior Outpatient Therapy:    Past Medical History:  Past Medical History:  Diagnosis Date  . Asthma   . Bacterial vaginosis   . Ovarian cyst   . Thalassemia   . Vulvar dermatitis    History reviewed. No pertinent surgical history. Family History:  Family History  Problem Relation Age of Onset  . Hyperlipidemia Mother   . Hypertension Mother   . Diabetes Mother   . Cancer Father 15       colon cancer   Family Psychiatric  History: Social History:  Social History   Substance and Sexual Activity  Alcohol Use No     Social History   Substance and Sexual Activity  Drug Use No    Social History   Socioeconomic History  . Marital status: Married    Spouse name: Not on file  . Number of children: Not on file  . Years of education: Not on file  .  Highest education level: Not on file  Occupational History  . Not on file  Tobacco Use  . Smoking status: Never Smoker  . Smokeless tobacco: Never Used  Substance and Sexual Activity  . Alcohol use: No  . Drug use: No  . Sexual activity: Never  Other Topics Concern  . Not on file  Social History Narrative   Working [employer].    Mandarin  [place];    Has been in [place] for 2 years.    Lives with husband.   3 boys; no complications for pregnancies.      Social Determinants of Health   Financial Resource Strain:   . Difficulty of Paying Living Expenses:   Food Insecurity:   . Worried About Charity fundraiser in the Last Year:   . Arboriculturist in the Last Year:   Transportation Needs:   . Film/video editor (Medical):   Marland Kitchen Lack of Transportation (Non-Medical):   Physical Activity:   . Days of Exercise per Week:   . Minutes of Exercise per Session:   Stress:   . Feeling of Stress :   Social Connections:   . Frequency of Communication with Friends and Family:   . Frequency of Social Gatherings with Friends and Family:   . Attends Religious Services:   . Active Member of Clubs or Organizations:   . Attends Archivist Meetings:   Marland Kitchen Marital Status:    Additional Social History:    Allergies:   Allergies  Allergen Reactions  . Shellfish Allergy Itching  . Alcohol-Sulfur [Sulfur] Itching    Labs:  Results for orders placed or performed during the hospital encounter of 01/12/20 (from the past 48 hour(s))  HIV Antibody (routine testing w rflx)     Status: None   Collection Time: 01/14/20  3:14 AM  Result Value Ref Range   HIV Screen 4th Generation wRfx NON REACTIVE NON REACTIVE    Comment: Performed at [facility], [address], [place], [place] [address]  Vitamin B12     Status: None   Collection Time: 01/14/20  3:14 AM  Result Value Ref Range   Vitamin B-12 352 180 - 914 pg/mL    Comment: (NOTE) This assay is not validated for testing neonatal or myeloproliferative syndrome specimens for Vitamin B12 levels. Performed at [facility], [place] [address], [place], [place] [address]   ACTH     Status: None   Collection Time: 01/14/20  3:14 AM  Result Value Ref Range   C206 ACTH 16.6 7.2 - 63.3 pg/mL    Comment: (NOTE) ACTH reference interval for samples collected between  7 and 10 AM. Performed At: [facility] [place], [place] [id number] Rush Farmer MD [id number]   VITAMIN D 25 Hydroxy (Vit-D Deficiency, Fractures)     Status: Abnormal   Collection Time: 01/14/20  3:14 AM  Result Value Ref Range   Vit D, 25-Hydroxy 27.38 (L) 30 - 100 ng/mL    Comment: (NOTE) Vitamin D deficiency has been defined by the Institute of Medicine  and an Endocrine Society practice guideline as a level of serum 25-OH  vitamin D less than 20 ng/mL (1,2). The Endocrine Society went on to  further define vitamin D insufficiency as a level between 21 and 29  ng/mL (2). 1. IOM (Institute of Medicine). 2010. Dietary reference intakes for  calcium and D. [place]: The [employer]. 2. Holick MF,  Binkley [place], Bischoff-Ferrari HA, et al. Evaluation,  treatment, and prevention of vitamin D deficiency: an Endocrine  Society clinical practice guideline, JCEM. 2011 Jul; 96(7): 1911-30. Performed at [facility], [address] [place], [place] [address]     Current Facility-Administered Medications  Medication Dose Route Frequency Provider Last Rate Last Admin  . acetaminophen (TYLENOL) suppository 650 mg  650 mg Rectal Q4H PRN Charise Killian, MD      . acetaminophen (TYLENOL) tablet 650 mg  650 mg Oral Q6H PRN Calvert Cantor, MD   650 mg at 01/13/20 0957  . bisacodyl (DULCOLAX) EC tablet 5 mg  5 mg Oral Daily PRN Charise Killian, MD      . Chlorhexidine Gluconate Cloth 2 % PADS 6 each  6 each Topical Daily Calvert Cantor, MD   6 each at 01/14/20 1042  . LORazepam (ATIVAN) injection 1 mg  1 mg Intravenous Q4H PRN Calvert Cantor, MD   1 mg at 01/14/20 2104  . LORazepam (ATIVAN) injection 2 mg  2 mg Intravenous Once Jimmye Norman, NP      . ondansetron [facility]) tablet 4 mg  4 mg Oral Q6H PRN Charise Killian, MD       Or  . ondansetron [facility]) injection 4 mg  4 mg Intravenous Q6H PRN Charise Killian, MD      .  sodium chloride flush (NS) 0.9 % injection 3 mL  3 mL Intravenous Q12H Charise Killian, MD   3 mL at 01/15/20 1127  . traMADol (ULTRAM) tablet 50 mg  50 mg Oral Q8H PRN Charise Killian, MD         Blood pressure 124/89, pulse 93, temperature 97.6 F (36.4 C), temperature source Oral, resp. rate 18, height 5\' 2"  (1.575 m), weight 63 kg, last menstrual period 01/04/2020, SpO2 100 %.Body mass index is 25.4 kg/m.  General Appearance: Fairly Groomed  Eye Contact:  Fair  Speech:  Clear and Coherent  Volume:  Normal  Mood:  Euthymic  Affect:  Congruent  Thought Process:  Goal Directed  Orientation:  Full (Time, Place, and Person)  Thought Content:  Logical  Suicidal Thoughts:  No  Homicidal Thoughts:  No  Memory:  Immediate;   Fair  Judgement:  NA  Insight:  NA  Psychomotor Activity:  Decreased  Concentration:  Concentration: Fair  Recall:  NA  Fund of Knowledge:  NA  Language:  NA  Akathisia:  No  Handed:  Ambidextrous  AIMS (if indicated):     Assets:  Desire for Improvement Social Support  ADL's:  Intact  Cognition:  Impaired,  Mild  Sleep:      Doing better on the unit. She is somewhat unsteady on her feet. She recently swore at her husband  Treatment Plan Summary: Plan Reassess as needed. Likely not necessary. No need for psychiatric medications at this time.  Disposition: No evidence of imminent risk to self or others at present.   Patient does not meet criteria for psychiatric inpatient admission.  She can be discharged to home and the care of husband. He is aware of the reality that further improvement will be slow but steady.  03/05/2020, MD 01/15/2020 6:50 PM

## 2020-01-15 NOTE — Progress Notes (Signed)
Subjective: Patient resting in bed.  Cooperative.    Objective: Current vital signs: BP (!) 128/103   Pulse (!) 104   Temp (!) 97.5 F (36.4 C) (Oral)   Resp 18   Ht 5\' 2"  (1.575 m)   Wt 63 kg   LMP 01/04/2020 (Approximate)   SpO2 99%   BMI 25.40 kg/m  Vital signs in last 24 hours: Temp:  [97.3 F (36.3 C)-98.2 F (36.8 C)] 97.5 F (36.4 C) (04/16 1016) Pulse Rate:  [92-120] 104 (04/16 1016) Resp:  [17-20] 18 (04/16 0750) BP: (128-153)/(89-103) 128/103 (04/16 1016) SpO2:  [99 %-100 %] 99 % (04/16 1016)  Intake/Output from previous day: 04/15 0701 - 04/16 0700 In: 240 [P.O.:240] Out: -  Intake/Output this shift: No intake/output data recorded. Nutritional status:  Diet Order            Diet regular Room service appropriate? Yes; Fluid consistency: Thin  Diet effective now              Neurologic Exam: Mental Status: Alert.  Speech fluent.  Follows commands. Cranial Nerves: II: Visual fields grossly normal, pupils equal, round, reactive to light and accommodation III,IV, VI: ptosis not present, extra-ocular motions intact bilaterally V,VII: smile symmetric, facial light touch sensation normal bilaterally VIII: hearing normal bilaterally IX,X: gag reflex present XI: bilateral shoulder shrug XII: midline tongue extension Motor: 5/5 throughout Gait: Normal gait and station  Lab Results: Basic Metabolic Panel: Recent Labs  Lab 01/12/20 1058 01/13/20 0412  NA 140 136  K 3.5 3.4*  CL 103 103  CO2 26 23  GLUCOSE 109* 124*  BUN 14 8  CREATININE 0.50 0.45  CALCIUM 8.9 7.7*    Liver Function Tests: Recent Labs  Lab 01/12/20 1058  AST 21  ALT 20  ALKPHOS 50  BILITOT 0.8  PROT 8.0  ALBUMIN 4.4   No results for input(s): LIPASE, AMYLASE in the last 168 hours. No results for input(s): AMMONIA in the last 168 hours.  CBC: Recent Labs  Lab 01/12/20 1058 01/13/20 0412  WBC 10.6* 9.7  HGB 12.5 10.1*  HCT 38.0 31.9*  MCV 65.4* 68.8*  PLT 273  196    Cardiac Enzymes: No results for input(s): CKTOTAL, CKMB, CKMBINDEX, TROPONINI in the last 168 hours.  Lipid Panel: No results for input(s): CHOL, TRIG, HDL, CHOLHDL, VLDL, LDLCALC in the last 168 hours.  CBG: No results for input(s): GLUCAP in the last 168 hours.  Microbiology: Results for orders placed or performed during the hospital encounter of 01/12/20  Respiratory Panel by RT PCR (Flu A&B, Covid) - Nasopharyngeal Swab     Status: None   Collection Time: 01/12/20  4:06 PM   Specimen: Nasopharyngeal Swab  Result Value Ref Range Status   SARS Coronavirus 2 by RT PCR NEGATIVE NEGATIVE Final    Comment: (NOTE) SARS-CoV-2 target nucleic acids are NOT DETECTED. The SARS-CoV-2 RNA is generally detectable in upper respiratoy specimens during the acute phase of infection. The lowest concentration of SARS-CoV-2 viral copies this assay can detect is 131 copies/mL. A negative result does not preclude SARS-Cov-2 infection and should not be used as the sole basis for treatment or other patient management decisions. A negative result may occur with  improper specimen collection/handling, submission of specimen other than nasopharyngeal swab, presence of viral mutation(s) within the areas targeted by this assay, and inadequate number of viral copies (<131 copies/mL). A negative result must be combined with clinical observations, patient history, and epidemiological information. The  expected result is Negative. Fact Sheet for Patients:  PinkCheek.be Fact Sheet for Healthcare Providers:  GravelBags.it This test is not yet ap proved or cleared by the [place] FDA and  has been authorized for detection and/or diagnosis of SARS-CoV-2 by FDA under an Emergency Use Authorization (EUA). This EUA will remain  in effect (meaning this test can be used) for the duration of the COVID-19 declaration under Section 564(b)(1) of the  Act, 21 U.S.C. section 360bbb-3(b)(1), unless the authorization is terminated or revoked sooner.    Influenza A by PCR NEGATIVE NEGATIVE Final   Influenza B by PCR NEGATIVE NEGATIVE Final    Comment: (NOTE) The Xpert Xpress SARS-CoV-2/FLU/RSV assay is intended as an aid in  the diagnosis of influenza from Nasopharyngeal swab specimens and  should not be used as a sole basis for treatment. Nasal washings and  aspirates are unacceptable for Xpert Xpress SARS-CoV-2/FLU/RSV  testing. Fact Sheet for Patients: PinkCheek.be Fact Sheet for Healthcare Providers: GravelBags.it This test is not yet approved or cleared by the [place] FDA and  has been authorized for detection and/or diagnosis of SARS-CoV-2 by  FDA under an Emergency Use Authorization (EUA). This EUA will remain  in effect (meaning this test can be used) for the duration of the  Covid-19 declaration under Section 564(b)(1) of the Act, 21  U.S.C. section 360bbb-3(b)(1), unless the authorization is  terminated or revoked. Performed at [facility], [address] [place], [place] [address]   CULTURE, BLOOD (ROUTINE X 2) w Reflex to ID Panel     Status: None (Preliminary result)   Collection Time: 01/12/20  5:47 PM   Specimen: BLOOD  Result Value Ref Range Status   Specimen Description BLOOD RIGHT ANTECUBITAL  Final   Special Requests   Final    BOTTLES DRAWN AEROBIC AND ANAEROBIC Blood Culture adequate volume   Culture   Final    NO GROWTH 3 DAYS Performed at [facility], [address], [place], [place] [address]    Report Status PENDING  Incomplete  CULTURE, BLOOD (ROUTINE X 2) w Reflex to ID Panel     Status: None (Preliminary result)   Collection Time: 01/12/20  5:50 PM   Specimen: BLOOD  Result Value Ref Range Status   Specimen Description BLOOD BLOOD LEFT FOREARM  Final   Special Requests   Final    BOTTLES DRAWN AEROBIC AND  ANAEROBIC Blood Culture results may not be optimal due to an inadequate volume of blood received in culture bottles   Culture   Final    NO GROWTH 3 DAYS Performed at [facility], [address] [place], [place] [address]    Report Status PENDING  Incomplete    Coagulation Studies: No results for input(s): LABPROT, INR in the last 72 hours.  Imaging: EEG  Result Date: 01/13/2020 Alexis Goodell, MD     01/13/2020  5:01 PM ELECTROENCEPHALOGRAM REPORT Patient: Reinaldo Berber       Room #: IC10A-AA EEG No. ID: [id number] Age: 39 y.o.        Sex: female Requesting Physician: Rizwan Report Date:  01/13/2020       Interpreting Physician: Alexis Goodell History: Petra Dumler is an 39 y.o. female with altered mental status Medications: Ativan, Haldol Conditions of Recording:  This is a 21 channel routine scalp EEG performed with bipolar and monopolar montages arranged in accordance to the international 10/20 system of electrode placement. One channel was dedicated to EKG recording. The  patient is in the awake, drowsy and asleep states. Description:  The waking background activity consists of a low voltage, symmetrical, fairly well organized, 9 Hz alpha activity, seen from the parieto-occipital and posterior temporal regions.  Low voltage fast activity, poorly organized, is seen anteriorly and is at times superimposed on more posterior regions.  A mixture of theta and alpha rhythms are seen from the central and temporal regions. The patient drowses with slowing to irregular, low voltage theta and beta activity.  The patient goes in to a light sleep with symmetrical sleep spindles, vertex central sharp transients and irregular slow activity. The patient has an episode of leg shaking with no change in the background noted.  No epileptiform activity is noted.  Hyperventilation and intermittent photic stimulation were not performed. IMPRESSION: Normal electroencephalogram, awake and asleep. There are no focal  lateralizing or epileptiform features. Thana Farr, MD Neurology [phone] 01/13/2020, 4:59 PM   MR BRAIN/IAC W WO CONTRAST  Result Date: 01/14/2020 CLINICAL DATA:  Encephalopathy; encephalitis. EXAM: MRI HEAD WITHOUT AND WITH CONTRAST TECHNIQUE: Multiplanar, multiecho pulse sequences of the brain and surrounding structures were obtained without and with intravenous contrast. CONTRAST:  74mL GADAVIST GADOBUTROL 1 MMOL/ML IV SOLN COMPARISON:  Noncontrast head CT 01/12/2019 FINDINGS: Brain: The examination is intermittently motion degraded, limiting evaluation. Most notably there is moderate motion degradation of the axial T2/FLAIR sequence, moderate/severe motion degradation of the coronal T1 weighted precontrast sequence through the IACs, moderate motion degradation of the axial T1 weighted precontrast sequence through the IACs, moderate motion degradation of the postcontrast T1 weighted sequence through the IACs, moderate/severe motion degradation of the coronal postcontrast T1 weighted sequence through the IAC's and moderate motion degradation of the axial and coronal whole brain postcontrast T1 weighted imaging. There is no evidence of acute infarct. No evidence of intracranial mass. Specifically, no cerebellopontine angle or internal auditory canal lesion is demonstrated. No midline shift or extra-axial fluid collection. No chronic intracranial blood products are identified. No focal parenchymal signal abnormality is identified. Cerebral volume is normal for age.  Partially empty sella turcica. No abnormal intracranial enhancement is identified. Vascular: Flow voids maintained within the proximal large arterial vessels. Skull and upper cervical spine: No focal marrow lesion. Sinuses/Orbits: Visualized orbits demonstrate no acute abnormality. Trace ethmoid sinus mucosal thickening. No significant mastoid effusion. IMPRESSION: Significantly motion degraded and limited examination as described. No evidence  of intracranial mass or acute intracranial abnormality. Electronically Signed   By: Jackey Loge DO   On: 01/14/2020 15:00    Medications:  I have reviewed the patient's current medications. Scheduled: . Chlorhexidine Gluconate Cloth  6 each Topical Daily  . LORazepam  2 mg Intravenous Once  . sodium chloride flush  3 mL Intravenous Q12H    Assessment/Plan: 39 y.o.femalewith a historyof depression, allergies, asthmawho presentswith abnormal behavior. Patient agitated, biting, kicking, screaming and taking out IV's. Psychiatry evaluated patient and felt that other etiologies other than psychiatric ones should be ruled out. Lab work unremarkable including TSH, RPR, UDS. Last Calcium was low. Head CT personally reviewed and shows no acute changes. Patient spiked a fever. LP was performed and was unremarkable with no white cells, normal protein and glucose. EEG unremarkable.  MRI of the brain personally reviewed and although with artifact shows no acute changes.  B12 normal.  Vitamin D low. ACTH and thiamine are pending.   Psych following patient.  Recommendations: 1. Vitamin D supplementation 2. No further neurologic intervention is recommended at this time.  If  further questions arise, please call or page at that time.  Thank you for allowing neurology to participate in the care of this patient.   LOS: 2 days   Thana Farr, MD Neurology [phone] 01/15/2020  10:24 AM

## 2020-01-15 NOTE — Progress Notes (Signed)
Patient received awake and alert pacing around the room with IVC sitter present. PRN ativan administered with positive effects of relaxing patient and she was able to sleep through the night. Patient wa sin possession of her cell phone and other personal items that were placed in a belongings bag and placed at the nurses station. Safety maintained. Fluids offered and encouraged. Will endorse to oncoming RN.

## 2020-01-15 NOTE — Progress Notes (Addendum)
PROGRESS NOTE    Marissa Walsh   OZY:[id number]  DOB: [birth year]  DOA: 01/12/2020 PCP: Allegra Grana, FNP   Brief Narrative:  Marissa Walsh is a 39 y/o F w/ PMH of depression, allergies, asthma who presents w/ confusion and severe agitation. She had not slept in 2 and a half days. She was noted to be aggressive and yelling in the ED and was sedated. In the ED she was noted to have a temp of 101. She was admitted for a medical work up for her mental status change.     Subjective: She hs no complaints today. Per Designer, television/film set, she appears restless. She woke up today and said she needed to exercise.   Assessment & Plan:   Active Problems:     Acute metabolic encephalopathy with fever x 1 in ED (temp was 101) - still agitated in ED> kicking staff and pulling out IVs  - appreciate neuro and psych evals - medical work up including and LP and EEG is unrevealing as to the cause of her confusion - no recurrence of the fever - she was apparently taking some odd supplement at home called TH90 which she bought off the Internet which Dr Burgess Estelle (psych) thinks may have contributed - 4/15> apparently very agitated the night before, hallucinations and delusions mentioned RN's note- given IV ativan as Seroquel did not help - will not d/c home yet- awaiting further opinion from psych - Ativan 1mg  Q4 hrs PRN started as she stated that she was having anxiety associated with blurred vision after my conversation around 6 PM- she was able to sleep al night - ? Mania- not sleeping unless given sedative, tachycardic, hypertensive  Thalassemia- microcytic anemia - never needed a blood transfusion  Vit D deficiency - give 50 K units today    Time spent in minutes: 35 DVT prophylaxis: SCDs Code Status: Full code Family Communication: Husband via interpretor  Disposition Plan: from home- awaiting psych re-eval today Consultants:   Psych  Neuro  Procedures:   EEG  LP Antimicrobials:  Anti-infectives  (From admission, onward)   Start     Dose/Rate Route Frequency Ordered Stop   01/12/20 1800  acyclovir (ZOVIRAX) 630 mg in dextrose 5 % 100 mL IVPB     10 mg/kg  63 kg 112.6 mL/hr over 60 Minutes Intravenous  Once 01/12/20 1721 01/12/20 1904   01/12/20 1730  cefTRIAXone (ROCEPHIN) 2 g in sodium chloride 0.9 % 100 mL IVPB     2 g 200 mL/hr over 30 Minutes Intravenous  Once 01/12/20 1721 01/12/20 1828   01/12/20 1730  vancomycin (VANCOCIN) IVPB 1000 mg/200 mL premix     1,000 mg 200 mL/hr over 60 Minutes Intravenous  Once 01/12/20 1721 01/12/20 1904       Objective: Vitals:   01/15/20 0750 01/15/20 1000 01/15/20 1016 01/15/20 1201  BP: (!) 153/98  (!) 128/103 (!) 136/101  Pulse: (!) 117 (!) 120 (!) 104 96  Resp: 18     Temp: (!) 97.3 F (36.3 C)  (!) 97.5 F (36.4 C) 97.6 F (36.4 C)  TempSrc: Axillary  Oral Oral  SpO2:   99% 100%  Weight:      Height:        Intake/Output Summary (Last 24 hours) at 01/15/2020 1232 Last data filed at 01/14/2020 2115 Gross per 24 hour  Intake 240 ml  Output --  Net 240 ml   Filed Weights   01/12/20 1055  Weight: 63 kg  Examination: General exam: Appears comfortable  HEENT: PERRLA, oral mucosa moist, no sclera icterus or thrush Respiratory system: Clear to auscultation. Respiratory effort normal. Cardiovascular system: S1 & S2 heard,  No murmurs, tachycadic Gastrointestinal system: Abdomen soft, non-tender, nondistended. Normal bowel sounds   Central nervous system: Alert and oriented. No focal neurological deficits. Extremities: No cyanosis, clubbing or edema Skin: No rashes or ulcers Psychiatry:  Mood & affect appropriate. Pleasant mood   Data Reviewed: I have personally reviewed following labs and imaging studies  CBC: Recent Labs  Lab 01/12/20 1058 01/13/20 0412  WBC 10.6* 9.7  HGB 12.5 10.1*  HCT 38.0 31.9*  MCV 65.4* 68.8*  PLT 273 196   Basic Metabolic Panel: Recent Labs  Lab 01/12/20 1058 01/13/20 0412  NA  140 136  K 3.5 3.4*  CL 103 103  CO2 26 23  GLUCOSE 109* 124*  BUN 14 8  CREATININE 0.50 0.45  CALCIUM 8.9 7.7*   GFR: Estimated Creatinine Clearance: 82.4 mL/min (by C-G formula based on SCr of 0.45 mg/dL). Liver Function Tests: Recent Labs  Lab 01/12/20 1058  AST 21  ALT 20  ALKPHOS 50  BILITOT 0.8  PROT 8.0  ALBUMIN 4.4   No results for input(s): LIPASE, AMYLASE in the last 168 hours. No results for input(s): AMMONIA in the last 168 hours. Coagulation Profile: No results for input(s): INR, PROTIME in the last 168 hours. Cardiac Enzymes: No results for input(s): CKTOTAL, CKMB, CKMBINDEX, TROPONINI in the last 168 hours. BNP (last 3 results) No results for input(s): PROBNP in the last 8760 hours. HbA1C: No results for input(s): HGBA1C in the last 72 hours. CBG: No results for input(s): GLUCAP in the last 168 hours. Lipid Profile: No results for input(s): CHOL, HDL, LDLCALC, TRIG, CHOLHDL, LDLDIRECT in the last 72 hours. Thyroid Function Tests: No results for input(s): TSH, T4TOTAL, FREET4, T3FREE, THYROIDAB in the last 72 hours. Anemia Panel: Recent Labs    01/13/20 0412 01/14/20 0314  VITAMINB12  --  352  FERRITIN 31  --    Urine analysis:    Component Value Date/Time   COLORURINE YELLOW (A) 01/12/2020 1606   APPEARANCEUR CLEAR (A) 01/12/2020 1606   LABSPEC 1.021 01/12/2020 1606   PHURINE 6.0 01/12/2020 1606   GLUCOSEU NEGATIVE 01/12/2020 1606   HGBUR NEGATIVE 01/12/2020 1606   BILIRUBINUR NEGATIVE 01/12/2020 1606   BILIRUBINUR neg 08/19/2018 0937   KETONESUR 20 (A) 01/12/2020 1606   PROTEINUR NEGATIVE 01/12/2020 1606   UROBILINOGEN 0.2 08/19/2018 0937   NITRITE NEGATIVE 01/12/2020 1606   LEUKOCYTESUR NEGATIVE 01/12/2020 1606   Sepsis Labs: @LABRCNTIP (procalcitonin:4,lacticidven:4) ) Recent Results (from the past 240 hour(s))  Respiratory Panel by RT PCR (Flu A&B, Covid) - Nasopharyngeal Swab     Status: None   Collection Time: 01/12/20  4:06 PM     Specimen: Nasopharyngeal Swab  Result Value Ref Range Status   SARS Coronavirus 2 by RT PCR NEGATIVE NEGATIVE Final    Comment: (NOTE) SARS-CoV-2 target nucleic acids are NOT DETECTED. The SARS-CoV-2 RNA is generally detectable in upper respiratoy specimens during the acute phase of infection. The lowest concentration of SARS-CoV-2 viral copies this assay can detect is 131 copies/mL. A negative result does not preclude SARS-Cov-2 infection and should not be used as the sole basis for treatment or other patient management decisions. A negative result may occur with  improper specimen collection/handling, submission of specimen other than nasopharyngeal swab, presence of viral mutation(s) within the areas targeted by this assay, and  inadequate number of viral copies (<131 copies/mL). A negative result must be combined with clinical observations, patient history, and epidemiological information. The expected result is Negative. Fact Sheet for Patients:  PinkCheek.be Fact Sheet for Healthcare Providers:  GravelBags.it This test is not yet ap proved or cleared by the [place] FDA and  has been authorized for detection and/or diagnosis of SARS-CoV-2 by FDA under an Emergency Use Authorization (EUA). This EUA will remain  in effect (meaning this test can be used) for the duration of the COVID-19 declaration under Section 564(b)(1) of the Act, 21 U.S.C. section 360bbb-3(b)(1), unless the authorization is terminated or revoked sooner.    Influenza A by PCR NEGATIVE NEGATIVE Final   Influenza B by PCR NEGATIVE NEGATIVE Final    Comment: (NOTE) The Xpert Xpress SARS-CoV-2/FLU/RSV assay is intended as an aid in  the diagnosis of influenza from Nasopharyngeal swab specimens and  should not be used as a sole basis for treatment. Nasal washings and  aspirates are unacceptable for Xpert Xpress SARS-CoV-2/FLU/RSV  testing. Fact  Sheet for Patients: PinkCheek.be Fact Sheet for Healthcare Providers: GravelBags.it This test is not yet approved or cleared by the [place] FDA and  has been authorized for detection and/or diagnosis of SARS-CoV-2 by  FDA under an Emergency Use Authorization (EUA). This EUA will remain  in effect (meaning this test can be used) for the duration of the  Covid-19 declaration under Section 564(b)(1) of the Act, 21  U.S.C. section 360bbb-3(b)(1), unless the authorization is  terminated or revoked. Performed at [facility], [address] [place], [place] [address]   CULTURE, BLOOD (ROUTINE X 2) w Reflex to ID Panel     Status: None (Preliminary result)   Collection Time: 01/12/20  5:47 PM   Specimen: BLOOD  Result Value Ref Range Status   Specimen Description BLOOD RIGHT ANTECUBITAL  Final   Special Requests   Final    BOTTLES DRAWN AEROBIC AND ANAEROBIC Blood Culture adequate volume   Culture   Final    NO GROWTH 3 DAYS Performed at [facility], [address], [place], [place] [address]    Report Status PENDING  Incomplete  CULTURE, BLOOD (ROUTINE X 2) w Reflex to ID Panel     Status: None (Preliminary result)   Collection Time: 01/12/20  5:50 PM   Specimen: BLOOD  Result Value Ref Range Status   Specimen Description BLOOD BLOOD LEFT FOREARM  Final   Special Requests   Final    BOTTLES DRAWN AEROBIC AND ANAEROBIC Blood Culture results may not be optimal due to an inadequate volume of blood received in culture bottles   Culture   Final    NO GROWTH 3 DAYS Performed at [facility], [address], [place], [place] [address]    Report Status PENDING  Incomplete         Radiology Studies: EEG  Result Date: 01/13/2020 Alexis Goodell, MD     01/13/2020  5:01 PM ELECTROENCEPHALOGRAM REPORT Patient: Reinaldo Berber       Room #: IC10A-AA EEG No. ID: [id number] Age: 39 y.o.        Sex: female  Requesting Physician: Shun Pletz Report Date:  01/13/2020       Interpreting Physician: Alexis Goodell History: Dashae Wilcher is an 39 y.o. female with altered mental status Medications: Ativan, Haldol Conditions of Recording:  This is a 21 channel routine scalp EEG performed with bipolar and monopolar montages arranged in accordance to the  international 10/20 system of electrode placement. One channel was dedicated to EKG recording. The patient is in the awake, drowsy and asleep states. Description:  The waking background activity consists of a low voltage, symmetrical, fairly well organized, 9 Hz alpha activity, seen from the parieto-occipital and posterior temporal regions.  Low voltage fast activity, poorly organized, is seen anteriorly and is at times superimposed on more posterior regions.  A mixture of theta and alpha rhythms are seen from the central and temporal regions. The patient drowses with slowing to irregular, low voltage theta and beta activity.  The patient goes in to a light sleep with symmetrical sleep spindles, vertex central sharp transients and irregular slow activity. The patient has an episode of leg shaking with no change in the background noted.  No epileptiform activity is noted.  Hyperventilation and intermittent photic stimulation were not performed. IMPRESSION: Normal electroencephalogram, awake and asleep. There are no focal lateralizing or epileptiform features. Thana Farr, MD Neurology [phone] 01/13/2020, 4:59 PM   MR BRAIN/IAC W WO CONTRAST  Result Date: 01/14/2020 CLINICAL DATA:  Encephalopathy; encephalitis. EXAM: MRI HEAD WITHOUT AND WITH CONTRAST TECHNIQUE: Multiplanar, multiecho pulse sequences of the brain and surrounding structures were obtained without and with intravenous contrast. CONTRAST:  73mL GADAVIST GADOBUTROL 1 MMOL/ML IV SOLN COMPARISON:  Noncontrast head CT 01/12/2019 FINDINGS: Brain: The examination is intermittently motion degraded, limiting evaluation.  Most notably there is moderate motion degradation of the axial T2/FLAIR sequence, moderate/severe motion degradation of the coronal T1 weighted precontrast sequence through the IACs, moderate motion degradation of the axial T1 weighted precontrast sequence through the IACs, moderate motion degradation of the postcontrast T1 weighted sequence through the IACs, moderate/severe motion degradation of the coronal postcontrast T1 weighted sequence through the IAC's and moderate motion degradation of the axial and coronal whole brain postcontrast T1 weighted imaging. There is no evidence of acute infarct. No evidence of intracranial mass. Specifically, no cerebellopontine angle or internal auditory canal lesion is demonstrated. No midline shift or extra-axial fluid collection. No chronic intracranial blood products are identified. No focal parenchymal signal abnormality is identified. Cerebral volume is normal for age.  Partially empty sella turcica. No abnormal intracranial enhancement is identified. Vascular: Flow voids maintained within the proximal large arterial vessels. Skull and upper cervical spine: No focal marrow lesion. Sinuses/Orbits: Visualized orbits demonstrate no acute abnormality. Trace ethmoid sinus mucosal thickening. No significant mastoid effusion. IMPRESSION: Significantly motion degraded and limited examination as described. No evidence of intracranial mass or acute intracranial abnormality. Electronically Signed   By: Jackey Loge DO   On: 01/14/2020 15:00      Scheduled Meds: . Chlorhexidine Gluconate Cloth  6 each Topical Daily  . LORazepam  2 mg Intravenous Once  . sodium chloride flush  3 mL Intravenous Q12H   Continuous Infusions:    LOS: 2 days      Calvert Cantor, MD [facility] Pager: [email or web address] 01/15/2020, 12:32 PM

## 2020-01-16 DIAGNOSIS — D569 Thalassemia, unspecified: Secondary | ICD-10-CM | POA: Diagnosis not present

## 2020-01-16 DIAGNOSIS — E559 Vitamin D deficiency, unspecified: Secondary | ICD-10-CM | POA: Diagnosis not present

## 2020-01-16 DIAGNOSIS — D509 Iron deficiency anemia, unspecified: Secondary | ICD-10-CM | POA: Diagnosis not present

## 2020-01-16 NOTE — Discharge Instructions (Signed)

## 2020-01-16 NOTE — Discharge Summary (Signed)
Physician Discharge Summary  Marissa Walsh GNF:[id number] DOB: June 30, 1981 DOA: 01/12/2020  PCP: Allegra GranaArnett, Margaret G, FNP  Admit date: 01/12/2020 Discharge date: 01/16/2020  Admitted From: home Disposition:  home   Recommendations for Outpatient Follow-up:  1. PCP will need to recheck Vit D levels and replace  Home Health:  none  Discharge Condition:  stable   CODE STATUS:  Full code   Diet recommendation:  Regular diet Consultations:  Psych  neurology  Procedures/Studies: . EEG- Normal electroencephalogram, awake and asleep. There are no focal lateralizing or epileptiform features. . LP   Discharge Diagnoses:  Active Problems:   Confusion   Acute metabolic encephalopathy     Brief Summary: Marissa Walsh is a 39 y/o F w/ PMH of depression, allergies, asthmawho presents w/ confusion and severe agitation. She had not slept in 2 and a half days. She was noted to be aggressive and yelling in the ED and was sedated. In the ED she was noted to have a temp of 101 and a lactic acid of 4.1 She was admitted for a medical work up for her mental status change. CT head was negative. LP done in ER    Hospital Course:  Acute metabolic encephalopathy with fever x 1 in ED (temp was 101) - agitated in ED> kicking staff and pulling out IVs  - IVC done in ED - appreciate neuro and psych evals - UDS negative - HIV non reactive - HSV 1 and d DNA neg - RPR and VDRL non reactive - APAP and salicylate levels neg - Thyroid functions normal - ACTH normal -   LP, EEG and MRI is unrevealing as to the cause of her confusion - Neuro feels there is a psychiatric etiology as do I - no recurrence of the fever - she was apparently taking some odd supplement at home called TH90 which she bought off the Internet that Dr Burgess Estelleanner (psych) thinks may have contributed- the patient states that she stopped it a month ago  - 4/15> apparently very agitated the night before, hallucinations and delusions mentioned RN's  note- given IV ativan as Seroquel did not help - will not d/c home yet- awaiting further opinion from psych - she continued to have episodes of anxiety where she would get fidgety and state she could not see or her vision was blurred- these would resolve with Ativan - Psych re-evaluated the patient and again though her symptoms were related to the TH90 which she bought over the Internet-  psych feel she is stable for d/c to home - IVC d/c'd today- patient appears calm and present- husband is present- both understand plan to f/u with PCP later this week  Lactic acidosis LA 4.1 in ED- improved to 1.1  Thalassemia- microcytic anemia Results for Marissa Walsh (MRN [id number]) as of 01/16/2020 11:54  Ref. Range 01/12/2020 10:58 01/12/2020 16:06 01/12/2020 17:47 01/13/2020 04:12 01/14/2020 03:14  Iron Latest Ref Range: 28 - 170 ug/dL 70      UIBC Latest Units: ug/dL 841350      TIBC Latest Ref Range: 250 - 450 ug/dL 324420      Saturation Ratios Latest Ref Range: 10.4 - 31.8 % 17      Ferritin Latest Ref Range: 11 - 307 ng/mL    31    - has never needed a blood transfusion  Vit D deficiency- level is 27.38 lower limit being 30 - given 50 Vit D units on 4/16   Discharge Exam: Vitals:   01/16/20 0802  01/16/20 1013  BP: 104/77 (!) 134/96  Pulse: 92 100  Resp: 18 18  Temp: 98.1 F (36.7 C) 98.2 F (36.8 C)  SpO2: 99% 99%   Vitals:   01/16/20 0308 01/16/20 0558 01/16/20 0802 01/16/20 1013  BP: (!) 126/95 (!) 125/96 104/77 (!) 134/96  Pulse: (!) 113 94 92 100  Resp: Temp:   98.1 F (36.7 C) 98.2 F (36.8 C)  TempSrc:   Oral Oral  SpO2: 98% 98% 99% 99%  Weight:      Height:        General: Pt is alert, awake, not in acute distress Cardiovascular: RRR, S1/S2 +, no rubs, no gallops Respiratory: CTA bilaterally, no wheezing, no rhonchi Abdominal: Soft, NT, ND, bowel sounds + Extremities: no edema, no cyanosis   Discharge Instructions  Discharge Instructions    Diet - low sodium  heart healthy   Complete by: As directed    Increase activity slowly   Complete by: As directed      Allergies as of 01/16/2020      Reactions   Shellfish Allergy Itching   Alcohol-sulfur [sulfur] Itching      Medication List    STOP taking these medications   ibuprofen 600 MG tablet Commonly known as: ADVIL      Follow-up Information    Allegra Grana, FNP. Schedule an appointment as soon as possible for a visit.   Specialty: Family Medicine Why: later this week. Please ask office to obtain medical records from hospital stay at [facility] prior to appointment. Contact information: [address] Laurell Josephs [address] [place] [address] [phone]          Allergies  Allergen Reactions  . Shellfish Allergy Itching  . Alcohol-Sulfur [Sulfur] Itching      EEG  Result Date: 01/13/2020 Thana Farr, MD     01/13/2020  5:01 PM ELECTROENCEPHALOGRAM REPORT Patient: Marissa Walsh       Room #: IC10A-AA EEG No. ID: [id number] Age: 39 y.o.        Sex: female Requesting Physician: Ainhoa Rallo Report Date:  01/13/2020       Interpreting Physician: Thana Farr History: Marissa Walsh is an 39 y.o. female with altered mental status Medications: Ativan, Haldol Conditions of Recording:  This is a 21 channel routine scalp EEG performed with bipolar and monopolar montages arranged in accordance to the international 10/20 system of electrode placement. One channel was dedicated to EKG recording. The patient is in the awake, drowsy and asleep states. Description:  The waking background activity consists of a low voltage, symmetrical, fairly well organized, 9 Hz alpha activity, seen from the parieto-occipital and posterior temporal regions.  Low voltage fast activity, poorly organized, is seen anteriorly and is at times superimposed on more posterior regions.  A mixture of theta and alpha rhythms are seen from the central and temporal regions. The patient drowses with slowing to irregular, low voltage theta and  beta activity.  The patient goes in to a light sleep with symmetrical sleep spindles, vertex central sharp transients and irregular slow activity. The patient has an episode of leg shaking with no change in the background noted.  No epileptiform activity is noted.  Hyperventilation and intermittent photic stimulation were not performed. IMPRESSION: Normal electroencephalogram, awake and asleep. There are no focal lateralizing or epileptiform features. Thana Farr, MD Neurology [phone] 01/13/2020, 4:59 PM   CT Head Wo Contrast  Result Date: 01/12/2020 CLINICAL DATA:  Altered mental status with  insomnia and agitation EXAM: CT HEAD WITHOUT CONTRAST TECHNIQUE: Contiguous axial images were obtained from the base of the skull through the vertex without intravenous contrast. COMPARISON:  None. FINDINGS: Brain: Ventricles and sulci are normal in size and configuration. There is no intracranial mass, hemorrhage, extra-axial fluid collection, or midline shift. Brain parenchyma appears unremarkable. No evident acute infarct. Vascular: No hyperdense vessel.  No evident vascular calcification. Skull: Bony calvarium appears intact. Sinuses/Orbits: There is mucosal thickening in several ethmoid air cells. There is opacification in a portion of the superior left sphenoid sinus. Visualized orbits appear symmetric bilaterally. Other: Mastoid air cells are clear. IMPRESSION: Areas of paranasal sinus disease, primarily in the left sphenoid sinus region. Study otherwise unremarkable. Electronically Signed   By: Bretta Bang III M.D.   On: 01/12/2020 12:22   MR BRAIN/IAC W WO CONTRAST  Result Date: 01/14/2020 CLINICAL DATA:  Encephalopathy; encephalitis. EXAM: MRI HEAD WITHOUT AND WITH CONTRAST TECHNIQUE: Multiplanar, multiecho pulse sequences of the brain and surrounding structures were obtained without and with intravenous contrast. CONTRAST:  90mL GADAVIST GADOBUTROL 1 MMOL/ML IV SOLN COMPARISON:  Noncontrast head  CT 01/12/2019 FINDINGS: Brain: The examination is intermittently motion degraded, limiting evaluation. Most notably there is moderate motion degradation of the axial T2/FLAIR sequence, moderate/severe motion degradation of the coronal T1 weighted precontrast sequence through the IACs, moderate motion degradation of the axial T1 weighted precontrast sequence through the IACs, moderate motion degradation of the postcontrast T1 weighted sequence through the IACs, moderate/severe motion degradation of the coronal postcontrast T1 weighted sequence through the IAC's and moderate motion degradation of the axial and coronal whole brain postcontrast T1 weighted imaging. There is no evidence of acute infarct. No evidence of intracranial mass. Specifically, no cerebellopontine angle or internal auditory canal lesion is demonstrated. No midline shift or extra-axial fluid collection. No chronic intracranial blood products are identified. No focal parenchymal signal abnormality is identified. Cerebral volume is normal for age.  Partially empty sella turcica. No abnormal intracranial enhancement is identified. Vascular: Flow voids maintained within the proximal large arterial vessels. Skull and upper cervical spine: No focal marrow lesion. Sinuses/Orbits: Visualized orbits demonstrate no acute abnormality. Trace ethmoid sinus mucosal thickening. No significant mastoid effusion. IMPRESSION: Significantly motion degraded and limited examination as described. No evidence of intracranial mass or acute intracranial abnormality. Electronically Signed   By: Jackey Loge DO   On: 01/14/2020 15:00      The results of significant diagnostics from this hospitalization (including imaging, microbiology, ancillary and laboratory) are listed below for reference.     Microbiology: Recent Results (from the past 240 hour(s))  Respiratory Panel by RT PCR (Flu A&B, Covid) - Nasopharyngeal Swab     Status: None   Collection Time: 01/12/20   4:06 PM   Specimen: Nasopharyngeal Swab  Result Value Ref Range Status   SARS Coronavirus 2 by RT PCR NEGATIVE NEGATIVE Final    Comment: (NOTE) SARS-CoV-2 target nucleic acids are NOT DETECTED. The SARS-CoV-2 RNA is generally detectable in upper respiratoy specimens during the acute phase of infection. The lowest concentration of SARS-CoV-2 viral copies this assay can detect is 131 copies/mL. A negative result does not preclude SARS-Cov-2 infection and should not be used as the sole basis for treatment or other patient management decisions. A negative result may occur with  improper specimen collection/handling, submission of specimen other than nasopharyngeal swab, presence of viral mutation(s) within the areas targeted by this assay, and inadequate number of viral copies (<131 copies/mL). A negative  result must be combined with clinical observations, patient history, and epidemiological information. The expected result is Negative. Fact Sheet for Patients:  https://www.moore.com/ Fact Sheet for Healthcare Providers:  https://www.young.biz/ This test is not yet ap proved or cleared by the [place] FDA and  has been authorized for detection and/or diagnosis of SARS-CoV-2 by FDA under an Emergency Use Authorization (EUA). This EUA will remain  in effect (meaning this test can be used) for the duration of the COVID-19 declaration under Section 564(b)(1) of the Act, 21 U.S.C. section 360bbb-3(b)(1), unless the authorization is terminated or revoked sooner.    Influenza A by PCR NEGATIVE NEGATIVE Final   Influenza B by PCR NEGATIVE NEGATIVE Final    Comment: (NOTE) The Xpert Xpress SARS-CoV-2/FLU/RSV assay is intended as an aid in  the diagnosis of influenza from Nasopharyngeal swab specimens and  should not be used as a sole basis for treatment. Nasal washings and  aspirates are unacceptable for Xpert Xpress SARS-CoV-2/FLU/RSV   testing. Fact Sheet for Patients: https://www.moore.com/ Fact Sheet for Healthcare Providers: https://www.young.biz/ This test is not yet approved or cleared by the [place] FDA and  has been authorized for detection and/or diagnosis of SARS-CoV-2 by  FDA under an Emergency Use Authorization (EUA). This EUA will remain  in effect (meaning this test can be used) for the duration of the  Covid-19 declaration under Section 564(b)(1) of the Act, 21  U.S.C. section 360bbb-3(b)(1), unless the authorization is  terminated or revoked. Performed at [facility], [address] [place], [place] [address]   CULTURE, BLOOD (ROUTINE X 2) w Reflex to ID Panel     Status: None (Preliminary result)   Collection Time: 01/12/20  5:47 PM   Specimen: BLOOD  Result Value Ref Range Status   Specimen Description BLOOD RIGHT ANTECUBITAL  Final   Special Requests   Final    BOTTLES DRAWN AEROBIC AND ANAEROBIC Blood Culture adequate volume   Culture   Final    NO GROWTH 4 DAYS Performed at [facility], [address], [place], [place] [address]    Report Status PENDING  Incomplete  CULTURE, BLOOD (ROUTINE X 2) w Reflex to ID Panel     Status: None (Preliminary result)   Collection Time: 01/12/20  5:50 PM   Specimen: BLOOD  Result Value Ref Range Status   Specimen Description BLOOD BLOOD LEFT FOREARM  Final   Special Requests   Final    BOTTLES DRAWN AEROBIC AND ANAEROBIC Blood Culture results may not be optimal due to an inadequate volume of blood received in culture bottles   Culture   Final    NO GROWTH 4 DAYS Performed at [facility], [address] [place], [place] [address]    Report Status PENDING  Incomplete     Labs: BNP (last 3 results) No results for input(s): BNP in the last 8760 hours. Basic Metabolic Panel: Recent Labs  Lab 01/12/20 1058 01/13/20 0412  NA 140 136  K 3.5 3.4*  CL 103 103  CO2 26 23   GLUCOSE 109* 124*  BUN 14 8  CREATININE 0.50 0.45  CALCIUM 8.9 7.7*   Liver Function Tests: Recent Labs  Lab 01/12/20 1058  AST 21  ALT 20  ALKPHOS 50  BILITOT 0.8  PROT 8.0  ALBUMIN 4.4   No results for input(s): LIPASE, AMYLASE in the last 168 hours. No results for input(s): AMMONIA in the last 168 hours. CBC: Recent Labs  Lab 01/12/20  1058 01/13/20 0412  WBC 10.6* 9.7  HGB 12.5 10.1*  HCT 38.0 31.9*  MCV 65.4* 68.8*  PLT 273 196   Cardiac Enzymes: No results for input(s): CKTOTAL, CKMB, CKMBINDEX, TROPONINI in the last 168 hours. BNP: Invalid input(s): POCBNP CBG: No results for input(s): GLUCAP in the last 168 hours. D-Dimer No results for input(s): DDIMER in the last 72 hours. Hgb A1c No results for input(s): HGBA1C in the last 72 hours. Lipid Profile No results for input(s): CHOL, HDL, LDLCALC, TRIG, CHOLHDL, LDLDIRECT in the last 72 hours. Thyroid function studies No results for input(s): TSH, T4TOTAL, T3FREE, THYROIDAB in the last 72 hours.  Invalid input(s): FREET3 Anemia work up Recent Labs    01/14/20 0314  VITAMINB12 352   Urinalysis    Component Value Date/Time   COLORURINE YELLOW (A) 01/12/2020 1606   APPEARANCEUR CLEAR (A) 01/12/2020 1606   LABSPEC 1.021 01/12/2020 1606   PHURINE 6.0 01/12/2020 1606   GLUCOSEU NEGATIVE 01/12/2020 1606   HGBUR NEGATIVE 01/12/2020 1606   BILIRUBINUR NEGATIVE 01/12/2020 1606   BILIRUBINUR neg 08/19/2018 0937   KETONESUR 20 (A) 01/12/2020 1606   PROTEINUR NEGATIVE 01/12/2020 1606   UROBILINOGEN 0.2 08/19/2018 0937   NITRITE NEGATIVE 01/12/2020 1606   LEUKOCYTESUR NEGATIVE 01/12/2020 1606   Sepsis Labs Invalid input(s): PROCALCITONIN,  WBC,  LACTICIDVEN Microbiology Recent Results (from the past 240 hour(s))  Respiratory Panel by RT PCR (Flu A&B, Covid) - Nasopharyngeal Swab     Status: None   Collection Time: 01/12/20  4:06 PM   Specimen: Nasopharyngeal Swab  Result Value Ref Range Status   SARS  Coronavirus 2 by RT PCR NEGATIVE NEGATIVE Final    Comment: (NOTE) SARS-CoV-2 target nucleic acids are NOT DETECTED. The SARS-CoV-2 RNA is generally detectable in upper respiratoy specimens during the acute phase of infection. The lowest concentration of SARS-CoV-2 viral copies this assay can detect is 131 copies/mL. A negative result does not preclude SARS-Cov-2 infection and should not be used as the sole basis for treatment or other patient management decisions. A negative result may occur with  improper specimen collection/handling, submission of specimen other than nasopharyngeal swab, presence of viral mutation(s) within the areas targeted by this assay, and inadequate number of viral copies (<131 copies/mL). A negative result must be combined with clinical observations, patient history, and epidemiological information. The expected result is Negative. Fact Sheet for Patients:  PinkCheek.be Fact Sheet for Healthcare Providers:  GravelBags.it This test is not yet ap proved or cleared by the [place] FDA and  has been authorized for detection and/or diagnosis of SARS-CoV-2 by FDA under an Emergency Use Authorization (EUA). This EUA will remain  in effect (meaning this test can be used) for the duration of the COVID-19 declaration under Section 564(b)(1) of the Act, 21 U.S.C. section 360bbb-3(b)(1), unless the authorization is terminated or revoked sooner.    Influenza A by PCR NEGATIVE NEGATIVE Final   Influenza B by PCR NEGATIVE NEGATIVE Final    Comment: (NOTE) The Xpert Xpress SARS-CoV-2/FLU/RSV assay is intended as an aid in  the diagnosis of influenza from Nasopharyngeal swab specimens and  should not be used as a sole basis for treatment. Nasal washings and  aspirates are unacceptable for Xpert Xpress SARS-CoV-2/FLU/RSV  testing. Fact Sheet for Patients: PinkCheek.be Fact Sheet  for Healthcare Providers: GravelBags.it This test is not yet approved or cleared by the [place] FDA and  has been authorized for detection and/or diagnosis of SARS-CoV-2 by  FDA under  an Emergency Use Authorization (EUA). This EUA will remain  in effect (meaning this test can be used) for the duration of the  Covid-19 declaration under Section 564(b)(1) of the Act, 21  U.S.C. section 360bbb-3(b)(1), unless the authorization is  terminated or revoked. Performed at [facility], [address] [place], [place] [address]   CULTURE, BLOOD (ROUTINE X 2) w Reflex to ID Panel     Status: None (Preliminary result)   Collection Time: 01/12/20  5:47 PM   Specimen: BLOOD  Result Value Ref Range Status   Specimen Description BLOOD RIGHT ANTECUBITAL  Final   Special Requests   Final    BOTTLES DRAWN AEROBIC AND ANAEROBIC Blood Culture adequate volume   Culture   Final    NO GROWTH 4 DAYS Performed at [facility], [address], [place], [place] [address]    Report Status PENDING  Incomplete  CULTURE, BLOOD (ROUTINE X 2) w Reflex to ID Panel     Status: None (Preliminary result)   Collection Time: 01/12/20  5:50 PM   Specimen: BLOOD  Result Value Ref Range Status   Specimen Description BLOOD BLOOD LEFT FOREARM  Final   Special Requests   Final    BOTTLES DRAWN AEROBIC AND ANAEROBIC Blood Culture results may not be optimal due to an inadequate volume of blood received in culture bottles   Culture   Final    NO GROWTH 4 DAYS Performed at [facility], [address], [place], [place] [address]    Report Status PENDING  Incomplete     Time coordinating discharge in minutes: 65  SIGNED:   Calvert Cantor, MD  [facility] 01/16/2020, 10:36 AM

## 2020-01-16 NOTE — Progress Notes (Signed)
Patient had an uneventful night. PRN tramadol and  ativan was given for restlessness. Patient reported an inability to sleep and c/o headache. 1:1 IVC sitter in place, Safety maintained. Will continue to monitor and endorse.

## 2020-01-17 LAB — CULTURE, BLOOD (ROUTINE X 2)
Culture: NO GROWTH
Culture: NO GROWTH
Special Requests: ADEQUATE

## 2020-01-18 LAB — HSV 1/2 AB IGG/IGM CSF
HSV 1/2 Ab Screen IgG, CSF: 0.56 IV (ref <=0.89)
HSV 1/2 Ab, IgM, CSF: 0.19 IV (ref <=0.89)

## 2020-01-19 ENCOUNTER — Telehealth: Payer: Self-pay

## 2020-01-19 NOTE — Telephone Encounter (Signed)
Patient was recently discharged from the hospital on 01/16/20.  No TCM completed, patient does not qualify for TCM services due to [employer] coverage.  Per discharge summary patient needs a follow up with PCP. HFU scheduled 01/20/20 @ 11:30 AM.

## 2020-01-20 ENCOUNTER — Ambulatory Visit (INDEPENDENT_AMBULATORY_CARE_PROVIDER_SITE_OTHER): Payer: BLUE CROSS/BLUE SHIELD | Admitting: Family

## 2020-01-20 ENCOUNTER — Other Ambulatory Visit: Payer: Self-pay

## 2020-01-20 ENCOUNTER — Encounter: Payer: Self-pay | Admitting: Family

## 2020-01-20 VITALS — BP 98/64 | HR 90 | Temp 97.7°F | Ht 62.25 in | Wt 127.2 lb

## 2020-01-20 DIAGNOSIS — N83202 Unspecified ovarian cyst, left side: Secondary | ICD-10-CM

## 2020-01-20 DIAGNOSIS — F329 Major depressive disorder, single episode, unspecified: Secondary | ICD-10-CM | POA: Diagnosis not present

## 2020-01-20 DIAGNOSIS — F419 Anxiety disorder, unspecified: Secondary | ICD-10-CM

## 2020-01-20 DIAGNOSIS — D649 Anemia, unspecified: Secondary | ICD-10-CM | POA: Diagnosis not present

## 2020-01-20 DIAGNOSIS — F32A Depression, unspecified: Secondary | ICD-10-CM | POA: Insufficient documentation

## 2020-01-20 LAB — VITAMIN B1: Vitamin B1 (Thiamine): 112 nmol/L (ref 66.5–200.0)

## 2020-01-20 MED ORDER — TRAZODONE HCL 50 MG PO TABS: 25.0000 mg | ORAL_TABLET | Freq: Every evening | ORAL | 3 refills | Status: DC | PRN

## 2020-01-20 NOTE — Progress Notes (Signed)
Subjective:    Patient ID: Marissa Walsh, female    DOB: [birth year], 39 y.o.   MRN: [id number]  CC: Marissa Walsh is a 39 y.o. female who presents today for follow up.   HPI: Mandarin intrepreter present.   Unsure why she was hospitalized.   Ordered 'T90' from internet which had been on for 1 month during March. States that she was taking to loose weight which is why she started this medication.  Complains of Depression. Has never been on medication for this. Trouble sleeping which is the primarly complaint.   More irritable around menstrual cycles. This started prior to OTC supplement 'T90'.  Endorses menstrual cramping which makes it hard to work.  Cycles are shorter than have been in the past, 2 days. LMP 12/24/19. No heavy bleeding, clots. Doesn't take NSAIDs for pain. H/o ovarian cyst.  H/o anemia. Taking iron however not consistent. No blood in stool, melena, constipation.    No si/hi.   5 years ago father died from cancer and she was unable to see him. Recently has been sick.  Working in [employer], feels overworked. 6 days per week , 1030-10pm.  Has 3 children- 18, 8,10.  Grew up in [place] Has been in [place] for 22 years.    Ferritin 31 Iron 70 Hemoglobin 10 Vitamin D 27 TSH 1 Admitted to hospital 01/12/2020 for confusion, acute metabolic encephalopathy , lactic acidosis work-up.  Consulted with psychiatry, neurology EEG, LP and MRI unrevealing. HISTORY:  Past Medical History:  Diagnosis Date  . Asthma   . Bacterial vaginosis   . Ovarian cyst   . Thalassemia   . Vulvar dermatitis    History reviewed. No pertinent surgical history. Family History  Problem Relation Age of Onset  . Hyperlipidemia Mother   . Hypertension Mother   . Diabetes Mother   . Cancer Father 11       colon cancer    Allergies: Shellfish allergy and Alcohol-sulfur [sulfur] No current outpatient medications on file prior to visit.   No current facility-administered medications on file prior to visit.     Social History   Tobacco Use  . Smoking status: Never Smoker  . Smokeless tobacco: Never Used  Substance Use Topics  . Alcohol use: No  . Drug use: No    Review of Systems  Constitutional: Negative for chills and fever.  Respiratory: Negative for cough.   Cardiovascular: Negative for chest pain and palpitations.  Gastrointestinal: Negative for nausea and vomiting.  Psychiatric/Behavioral: Positive for sleep disturbance. Negative for confusion and suicidal ideas. The patient is nervous/anxious.       Objective:    BP 98/64   Pulse 90   Temp 97.7 F (36.5 C) (Temporal)   Ht 5' 2.25" (1.581 m)   Wt 127 lb 3.2 oz (57.7 kg)   LMP 01/04/2020 (Approximate)   SpO2 98%   BMI 23.08 kg/m  BP Readings from Last 3 Encounters:  01/20/20 98/64  01/16/20 (!) 134/96  12/02/19 134/83   Wt Readings from Last 3 Encounters:  01/20/20 127 lb 3.2 oz (57.7 kg)  01/12/20 138 lb 14.2 oz (63 kg)  12/02/19 139 lb (63 kg)    Physical Exam Neurological:     Comments: Alert oriented x4  Psychiatric:        Speech: Speech normal.        Behavior: Behavior normal. Behavior is cooperative.     Comments: Patient is not labile, angered.  She is not shouting.  well dressed .        Assessment & Plan:   Problem List Items Addressed This Visit      Endocrine   Cyst of left ovary    Suspect this menorrhagia may be contributory to anemia. Pending stools card. GYN consult and US pelvic.         Relevant Orders   Ambulatory referral to Obstetrics / Gynecology   US Pelvic Complete With Transvaginal     Other   Anemia   Relevant Orders   Fecal occult blood, imunochemical   Anxiety and depression - Primary    No abnormal behaviors during visit today.  I do suspect that over-the-counter supplement may have been contributory and certainly lack of sleep.  Reviewed hospitalization with patient today. patient quite declines counseling at this time.  She was willing to trial of trazodone  with close follow up. Advised to remain off the supplement 'T90'      Relevant Medications   traZODone (DESYREL) 50 MG tablet       I am having Marissa Walsh start on traZODone.   Meds ordered this encounter  Medications  . traZODone (DESYREL) 50 MG tablet    Sig: Take 0.5-1 tablets (25-50 mg total) by mouth at bedtime as needed for sleep.    Dispense:  30 tablet    Refill:  3    Order Specific Question:   Supervising Provider    Answer:   Sherlene Shams [[id number]]    Return precautions given.   Risks, benefits, and alternatives of the medications and treatment plan prescribed today were discussed, and patient expressed understanding.   Education regarding symptom management and diagnosis given to patient on AVS.  Continue to follow with Marissa Grana, FNP for routine health maintenance.   Marissa Walsh and I agreed with plan.   Rennie Plowman, FNP

## 2020-01-20 NOTE — Assessment & Plan Note (Addendum)
No abnormal behaviors during visit today.  I do suspect that over-the-counter supplement may have been contributory and certainly lack of sleep.  Reviewed hospitalization with patient today. patient quite declines counseling at this time.  She was willing to trial of trazodone with close follow up. Advised to remain off the supplement 'T90'

## 2020-01-20 NOTE — Assessment & Plan Note (Addendum)
Suspect this menorrhagia may be contributory to anemia. Pending stools card. GYN consult and US pelvic.

## 2020-01-20 NOTE — Patient Instructions (Addendum)
I am sending you home with stool cards because of your anemia.  It is very very important you return this to our office as this screens for blood loss in your gastrointestinal tract.   Trial trazodone at bedtime as needed  Referral to OB/GYN and also ultrasound of pelvis ; let us know if you dont hear back within a week in regards to an appointment being scheduled.    Trazodone extended release oral tablets What is this medicine? TRAZODONE (TRAZ oh done) is used to treat depression. This medicine may be used for other purposes; ask your health care provider or pharmacist if you have questions. COMMON BRAND NAME(S): Oleptro What should I tell my health care provider before I take this medicine? They need to know if you have any of these conditions:  attempted suicide or thinking about it  bipolar disorder  bleeding problems  glaucoma  heart disease, or previous heart attack  irregular heart beat  kidney disease  liver disease  low levels of sodium in the blood  an unusual or allergic reaction to trazodone, other medicines, foods, dyes or preservatives  pregnant or trying to get pregnant  breast-feeding How should I use this medicine? Take this medicine by mouth with a glass of water. Follow the directions on the prescription label. Take this medicine on an empty stomach, at least 30 minutes before or 2 hours after food. Do not take with food. Do not crush or chew this medicine. You may break in half along the score line. Take your medicine at bedtime everyday. Do not take your medicine more often than directed. Do not stop taking this medicine suddenly except upon the advice of your doctor. Stopping this medicine too quickly may cause serious side effects or your condition may worsen. A special MedGuide will be given to you by the pharmacist with each prescription and refill. Be sure to read this information carefully each time. Talk to your pediatrician regarding the use of  this medicine in children. Special care may be needed. Overdosage: If you think you have taken too much of this medicine contact a poison control center or emergency room at once. NOTE: This medicine is only for you. Do not share this medicine with others. What if I miss a dose? If you miss a dose, take it as soon as you can. If it is almost time for your next dose, take only that dose. Do not take double or extra doses. What may interact with this medicine? Do not take this medicine with any of the following medications:  certain medicines for fungal infections like fluconazole, itraconazole, ketoconazole, posaconazole, voriconazole  cisapride  dronedarone  linezolid  MAOIs like Carbex, Eldepryl, Marplan, Nardil, and Parnate  mesoridazine  methylene blue (injected into a vein)  pimozide  saquinavir  thioridazine This medicine may also interact with the following medications:  alcohol  antiviral medicines for HIV or AIDS  aspirin and aspirin-like medicines  barbiturates like phenobarbital  certain medicines for blood pressure, heart disease, irregular heart beat  certain medicines for depression, anxiety, or psychotic disturbances  certain medicines for migraine headache like almotriptan, eletriptan, frovatriptan, naratriptan, rizatriptan, sumatriptan, zolmitriptan  certain medicines for seizures like carbamazepine, phenytoin  certain medicines for sleep  certain medicines that treat or prevent blood clots like dalteparin, enoxaparin, warfarin  digoxin  fentanyl  lithium  NSAIDS, medicines for pain and inflammation, like ibuprofen or naproxen  other medicines that prolong the QT interval (cause an abnormal heart rhythm) like  dofetilide  rasagiline  supplements like St. John's wort, kava kava, valerian  tramadol  tryptophan This list may not describe all possible interactions. Give your health care provider a list of all the medicines, herbs,  non-prescription drugs, or dietary supplements you use. Also tell them if you smoke, drink alcohol, or use illegal drugs. Some items may interact with your medicine. What should I watch for while using this medicine? Tell your doctor if your symptoms do not get better or if they get worse. Visit your doctor or health care professional for regular checks on your progress. Because it may take several weeks to see the full effects of this medicine, it is important to continue your treatment as prescribed by your doctor. Patients and their families should watch out for new or worsening thoughts of suicide or depression. Also watch out for sudden changes in feelings such as feeling anxious, agitated, panicky, irritable, hostile, aggressive, impulsive, severely restless, overly excited and hyperactive, or not being able to sleep. If this happens, especially at the beginning of treatment or after a change in dose, call your health care professional. Dennis Bast may get drowsy or dizzy. Do not drive, use machinery, or do anything that needs mental alertness until you know how this medicine affects you. Do not stand or sit up quickly, especially if you are an older patient. This reduces the risk of dizzy or fainting spells. Alcohol may interfere with the effect of this medicine. Avoid alcoholic drinks. This medicine may cause dry eyes and blurred vision. If you wear contact lenses you may feel some discomfort. Lubricating drops may help. See your eye doctor if the problem does not go away or is severe. Your mouth may get dry. Chewing sugarless gum, sucking hard candy and drinking plenty of water may help. Contact your doctor if the problem does not go away or is severe. What side effects may I notice from receiving this medicine? Side effects that you should report to your doctor or health care professional as soon as possible:  allergic reactions like skin rash, itching or hives, swelling of the face, lips, or  tongue  elevated mood, decreased need for sleep, racing thoughts, impulsive behavior  confusion  fast, irregular heartbeat  feeling faint or lightheaded, falls  feeling agitated, angry, or irritable  loss of balance or coordination  painful or prolonged erections  restlessness, pacing, inability to keep still  suicidal thoughts or other mood changes  tremors  trouble sleeping  seizures  unusual bleeding or bruising Side effects that usually do not require medical attention (report to your doctor or health care professional if they continue or are bothersome):  change in sex drive or performance  change in appetite or weight  constipation  headache  muscle aches or pains  nausea This list may not describe all possible side effects. Call your doctor for medical advice about side effects. You may report side effects to FDA at 1-800-FDA-1088. Where should I keep my medicine? Keep out of the reach of children. Store at room temperature between 15 and 30 degrees C (59 to 86 degrees F). Protect from light. Keep container tightly closed. Throw away any unused medicine after the expiration date. NOTE: This sheet is a summary. It may not cover all possible information. If you have questions about this medicine, talk to your doctor, pharmacist, or health care provider.  2020 Elsevier/Gold Standard (2018-09-09 11:44:32)

## 2020-01-22 ENCOUNTER — Telehealth: Payer: Self-pay | Admitting: Obstetrics and Gynecology

## 2020-01-22 ENCOUNTER — Other Ambulatory Visit: Payer: Self-pay

## 2020-01-22 ENCOUNTER — Ambulatory Visit: Payer: BLUE CROSS/BLUE SHIELD

## 2020-01-26 ENCOUNTER — Telehealth: Payer: Self-pay | Admitting: Family

## 2020-01-26 NOTE — Telephone Encounter (Signed)
I called pt to inform pt that her ins will cover at [facility] and should cover at [facility] med no vm

## 2020-01-29 ENCOUNTER — Inpatient Hospital Stay: Payer: BLUE CROSS/BLUE SHIELD | Admitting: Family

## 2020-01-29 ENCOUNTER — Other Ambulatory Visit: Payer: Self-pay

## 2020-01-29 ENCOUNTER — Other Ambulatory Visit: Payer: BLUE CROSS/BLUE SHIELD

## 2020-02-02 ENCOUNTER — Other Ambulatory Visit (INDEPENDENT_AMBULATORY_CARE_PROVIDER_SITE_OTHER): Payer: BLUE CROSS/BLUE SHIELD

## 2020-02-02 DIAGNOSIS — D649 Anemia, unspecified: Secondary | ICD-10-CM | POA: Diagnosis not present

## 2020-02-02 LAB — FECAL OCCULT BLOOD, IMMUNOCHEMICAL: Fecal Occult Bld: NEGATIVE

## 2020-02-03 ENCOUNTER — Telehealth: Payer: Self-pay

## 2020-02-03 NOTE — Telephone Encounter (Signed)
When I spoke to patient I had a very hard time getting patient to understand me. She does not know what an OB/GYN is even though I tried to explain multiple times. I asked if she wanted a referral to see them & maybe they could help determine if she needed a transvaginal US since patient is concerned of high cost. I told her that we did not know cost & that she may want to contact insurance company. Patient just did not understand this.  I went to one to ask about a psychiatrist & she initially wanted to see one because she said that she needs a daytime medication to help her with her racing thoughts. She said that she just "thinks & thinks & thinks". She said that it is not everyday & that she had children to take care of. She did not want to be crazy & just does not understand why she went from having no problems to this. I advised this is why she needed to be evaluated by a psychiatrist & see what medications may help patient. She said that she just tries to handle on her own. She said that she talks to her husband, sister & boys about what is going on with her. I told her that she may need a professional that understood better what she was dealing with. Patient kept going back & forth if she wanted to see a psychiatrist or a psychologist. I told patient that I was going to ask you to place referral because she needed some help on understanding what was going on in her mind. Patient stated that she had no thoughts of hurting herself or anyone else. I informed her that people see a psychiatrist for many reasons.

## 2020-02-03 NOTE — Telephone Encounter (Signed)
Marissa Walsh,   What is status of OB GYN consult?  Sarah,  Call pt She may also need to call her insurance company to see what payment will be. Unlikely that entire cost is covered as this a diagnostic exam.   If concern for cost, offer the below option-  I  Would advise seeing OB GYN first and they can determine if transvaginal US appropriate and I can do in office.   Is she taking trazodone?  How is her sleep ? In regard to psychologist versus psychiatrist.  She needs to understand the difference here.  Would she like to speak with a counselor or would like to see a psychiatrist who can prescribe medication? Would patient like [place] to look to move her appointment up earlier?  Please ensure that patient has interpreter coming with her on the day of her appointment

## 2020-02-03 NOTE — Telephone Encounter (Signed)
Pt returned call. Please call her back.

## 2020-02-03 NOTE — Telephone Encounter (Signed)
I tried to call patient at only available number, but VM not set up & could not leave a message.

## 2020-02-03 NOTE — Telephone Encounter (Signed)
Message from ob/gyn  Called via Mandarin interpreterIan [phone]  No VM set up. Unable to leave message.

## 2020-02-03 NOTE — Telephone Encounter (Signed)
Patient called and she stated that she missed her transvaginal US. She wanted to rescheduled but wanted to make sure that [employer] would cover. Marijo Conception do you have any idea if this was denied? She wanted to remake the appointment so I am assuming that it was covered, but patient thought it wasn't? I am confused here & patient stated that when she reschedules she needs a chinese mandrin interpreter.   Margaret patient also was requesting referral to see psychologist or psychiatrist.

## 2020-02-08 ENCOUNTER — Telehealth: Payer: Self-pay | Admitting: Family

## 2020-02-08 DIAGNOSIS — F419 Anxiety disorder, unspecified: Secondary | ICD-10-CM

## 2020-02-08 DIAGNOSIS — F32A Depression, unspecified: Secondary | ICD-10-CM

## 2020-02-08 DIAGNOSIS — F329 Major depressive disorder, single episode, unspecified: Secondary | ICD-10-CM

## 2020-02-08 NOTE — Telephone Encounter (Signed)
Marissa Walsh,   This is hard, she is hard to reach.  Can you try her one more time and schedule appt with an intrepretor so we can go through referral to GYN and psychiatry with her?  Please sch follow up with [place] If you can ask, Is she taking trazodone? This is for sleep, depression I placed urgent ref to psychiatry   Marissa Walsh,  can you start referral for patient?

## 2020-02-08 NOTE — Telephone Encounter (Signed)
Sarah, Why don't we give her info to [facility] and have her walk in today ? Remind me when the crisis counselor is there ? I will place referral for psychiatry as well however afraid it could be a couple weeks ( or more ) wait.

## 2020-02-08 NOTE — Telephone Encounter (Signed)
I spoke with patient via mandarin interpreter & let her know that referrals had been placed as well as to why they were placed. Patient goes back & forth with wanting to see psychiatry. I explained to her that not everyone who sees psychiatry has thoughts of hurting themselves or others. Patient stated that she understood this. I also asked her to please keep her phone near her next few days so if they call her with an appointment she can answer. Pt verbalized understanding at this time.

## 2020-02-08 NOTE — Telephone Encounter (Signed)
Also patient said that she did take trazodone a few times, but felt that she needed a daytime medication when she starts thinking & can't stop. I told her that is what a psychiatrist could help with as well.

## 2020-02-08 NOTE — Telephone Encounter (Signed)
Tried to call patient, but was unable to reach to see if she wanted to speak with someone from [facility] if needed. No VM set up to leave a message.  [facility] walk in hours M,W,F 8-2p.

## 2020-02-08 NOTE — Telephone Encounter (Signed)
close

## 2020-02-09 NOTE — Telephone Encounter (Signed)
Noted F/u scheduled with me in June

## 2020-02-11 ENCOUNTER — Other Ambulatory Visit: Payer: Self-pay | Admitting: Family

## 2020-02-11 DIAGNOSIS — F32A Depression, unspecified: Secondary | ICD-10-CM

## 2020-02-11 DIAGNOSIS — F419 Anxiety disorder, unspecified: Secondary | ICD-10-CM

## 2020-02-12 ENCOUNTER — Other Ambulatory Visit: Payer: Self-pay | Admitting: Family

## 2020-02-12 ENCOUNTER — Telehealth: Payer: Self-pay | Admitting: Family

## 2020-02-12 DIAGNOSIS — F32A Depression, unspecified: Secondary | ICD-10-CM

## 2020-02-12 DIAGNOSIS — F419 Anxiety disorder, unspecified: Secondary | ICD-10-CM

## 2020-02-12 MED ORDER — HYDROXYZINE HCL 10 MG PO TABS: 10.0000 mg | ORAL_TABLET | Freq: Two times a day (BID) | ORAL | 0 refills | Status: DC | PRN

## 2020-02-12 NOTE — Telephone Encounter (Signed)
Pt states that she can not get in touch with anyone at the Psychiatry office she was referred to. Please call pt back and give correct contact info

## 2020-02-12 NOTE — Telephone Encounter (Signed)
Patient called & informed that atarax was sent for her to pick up. I have instructed her on how to take & to make sure that she lets trazodone build in her system. Patient will pick up & stated that her racing thoughts were getting better until she took second dose of Moderna vaccine. Patient was asked to please call us next week & let us know how she is doing on atarax. Pt verbalized understanding & that we are working on getting her a psychiatry appointment.

## 2020-02-12 NOTE — Telephone Encounter (Signed)
Please speak with pt I have sent in atarax to take as needed Atarax is for anxiety; it will make her a little sleepy which is how it calms anxiety We have placed referral to psychiatry

## 2020-02-12 NOTE — Telephone Encounter (Signed)
Pt spoke to someone from [facility] and would like a Armed forces logistics/support/administrative officer. Information was given to me to resend. Thankyou!

## 2020-02-12 NOTE — Telephone Encounter (Signed)
Patient stopped by the office, she is still waiting for Claris Che to call her. Patient was advised that Claris Che is seeing patient's and will contact her as soon as possible.

## 2020-02-12 NOTE — Telephone Encounter (Signed)
The patient is needing day medication for racing thoughts. She stated she will start back taking her night time medication tonight . She also stated that her racing thoughts started back today after taking her second vaccine.

## 2020-02-12 NOTE — Telephone Encounter (Signed)
Please advise? Patient was wanting something for her acute racing thoughts today. She had stated that she would start taking trazodone daily. Please advise?

## 2020-02-15 ENCOUNTER — Telehealth: Payer: Self-pay | Admitting: Family

## 2020-02-15 DIAGNOSIS — N898 Other specified noninflammatory disorders of vagina: Secondary | ICD-10-CM

## 2020-02-15 DIAGNOSIS — F32A Depression, unspecified: Secondary | ICD-10-CM

## 2020-02-15 DIAGNOSIS — D509 Iron deficiency anemia, unspecified: Secondary | ICD-10-CM

## 2020-02-15 DIAGNOSIS — F419 Anxiety disorder, unspecified: Secondary | ICD-10-CM

## 2020-02-15 NOTE — Telephone Encounter (Signed)
Marissa Walsh,  Call patient Please advise her that we are having trouble finding a psychiatrist that speaks Mandarin.  I would like to ensure she is to follow-up scheduled with [place] here and that we have an interpreter present.    Started her on atarax and also she is taking trazodone every night.  Does she feels like her anxiety is improving?  Should we move her f/u appt up??

## 2020-02-15 NOTE — Telephone Encounter (Signed)
I spoke with patent & she did start taking the atarax. She also started back the trazodone even though she feels it makes her sweat at night. She is cold she said the rest if the time. She doesn't understand why she is so cold during the day to the point she always has to wear long sleeved shirts & sweat shirts during the day She said that she feels like her body is changing & she kept consistently saying this to me. She feels that she used to be able to be more active. She would ride bikes & play with her boys. Now she feels like doing nothing. She said that she now feels worse after 2nd covid vaccine. She feels that her thoughts have raced more. I did let patient now that she was anemic & needed labs rechecked at 6/2 appointment. She also has not been taking the iron bc she did not know what to buy when she goes to pharmacy. I advised that I would ask if you would sent as prescription so that she can pick up?   I let her know about referral to psychiatry & t complication of one that sleeps mandarin. Pt will be here 6/2 for her appointment.

## 2020-02-15 NOTE — Telephone Encounter (Signed)
I called Wei zhang, md [address]    [phone] it just rang. I called Daune Perch, md  [address], [phone]  Number not in service. Please advise and Thank you!

## 2020-02-16 MED ORDER — FERROUS SULFATE 325 (65 FE) MG PO TBEC: 325.0000 mg | DELAYED_RELEASE_TABLET | Freq: Two times a day (BID) | ORAL | 2 refills | Status: DC

## 2020-02-16 NOTE — Telephone Encounter (Signed)
Noted   Call pt I have prescribed iron

## 2020-02-16 NOTE — Telephone Encounter (Signed)
Patient called & informed that iron was sent as prescription. Also advised to please take with orange juice.

## 2020-02-19 ENCOUNTER — Other Ambulatory Visit: Payer: Self-pay | Admitting: Family

## 2020-02-19 DIAGNOSIS — F419 Anxiety disorder, unspecified: Secondary | ICD-10-CM

## 2020-02-19 DIAGNOSIS — F32A Depression, unspecified: Secondary | ICD-10-CM

## 2020-02-22 ENCOUNTER — Telehealth: Payer: Self-pay | Admitting: Family

## 2020-02-22 NOTE — Telephone Encounter (Signed)
Patient is scheduled tomorrow at 10am to discuss. Interpreter requested.

## 2020-02-22 NOTE — Telephone Encounter (Signed)
Pt wants to know if she can take two of the traZODone (DESYREL) 50 MG tablet at bedtime. She is still unable to sleep. Her body is sweating at night and still racing thoughts all day. Please advise.

## 2020-02-23 ENCOUNTER — Telehealth: Payer: Self-pay | Admitting: Family

## 2020-02-23 ENCOUNTER — Other Ambulatory Visit: Payer: Self-pay

## 2020-02-23 ENCOUNTER — Ambulatory Visit (INDEPENDENT_AMBULATORY_CARE_PROVIDER_SITE_OTHER): Payer: BLUE CROSS/BLUE SHIELD | Admitting: Family

## 2020-02-23 ENCOUNTER — Encounter: Payer: Self-pay | Admitting: Family

## 2020-02-23 VITALS — BP 116/72 | HR 98 | Temp 97.9°F | Ht 62.25 in | Wt 126.2 lb

## 2020-02-23 DIAGNOSIS — G4700 Insomnia, unspecified: Secondary | ICD-10-CM

## 2020-02-23 DIAGNOSIS — F329 Major depressive disorder, single episode, unspecified: Secondary | ICD-10-CM

## 2020-02-23 DIAGNOSIS — F419 Anxiety disorder, unspecified: Secondary | ICD-10-CM

## 2020-02-23 DIAGNOSIS — F32A Depression, unspecified: Secondary | ICD-10-CM

## 2020-02-23 MED ORDER — TRAZODONE HCL 50 MG PO TABS: 100.0000 mg | ORAL_TABLET | Freq: Every day | ORAL | 2 refills | Status: DC

## 2020-02-23 NOTE — Progress Notes (Signed)
Subjective:    Patient ID: Marissa Walsh, female    DOB: 1981/07/27, 39 y.o.   MRN: [id number]  CC: Marissa Walsh is a 39 y.o. female who presents today for follow up.   HPI: Accompanied by mandarin interpreter today. Discussed anxiety 'racing thoughts.'  Since being home from hospital,  She has been having frequent thoughts of what happened to her early in life.  Describes her grandmother was superstitous, and felt things were 'taboo'. 'During flowering season', her grandmother left her husband. She is worried that she may be like her grandmother. Asks 'is she crazy?Marland Kitchen Why do I need medications now? It is normal to worry? '    Per patient, grandmother was never diagnosed with bipolar or schizophrenia.  Patient does not feel her grandmother had mental illness, she just feels that she was very superstitious.  NO thoughts of hurting herself.  She has never attempted to hurt herself. No suicide plan. No hi.  Enjoys the beach. Went to [place] last year and working on planning another beach trip for her family.  Husband has plans for future and she feels that she does best for today.  3 boys, 9 and 40 yo are home in virtual school and fighting for her attention.  56 yo son is rebellious and describes that he looks down on her.  Father's relationship with sons is 'not great.'  Children are using phone and she is unhappy with use of too much technology.  Atarax has been somewhat helpful. Has also been focusing oner  Taking trazodone somewhat inconsistently. Sleep well last night and didn't take the medication.  Thinks helpful. More often her trouble is staying asleep.   Denies a period of having more energy than usual, didn't require sleep, spending more money than usual and got into trouble, a time when so hyper got into trouble, more irritated that you started arguments.  Denies that these acts caused trouble at work, financially, or with family or personal relationships.    CT head 12/2019 study  unremarkable with exception of paranasal sinus disease, primarily left-sided MRI brain 12/2018 No evidence of intracranial mass or acute intracranial abnormality. Reviewed psychiatric evaluation by Dr. Cindy Hazy  01/12/2020.  Did not feel she needed psychiatric medications needed at that time.  Did not feel that she is in imminent risk to herself or others. HISTORY:  Past Medical History:  Diagnosis Date  . Asthma   . Bacterial vaginosis   . Ovarian cyst   . Thalassemia   . Vulvar dermatitis    History reviewed. No pertinent surgical history. Family History  Problem Relation Age of Onset  . Hyperlipidemia Mother   . Hypertension Mother   . Diabetes Mother   . Cancer Father 97       colon cancer    Allergies: Shellfish allergy and Alcohol-sulfur [sulfur] Current Outpatient Medications on File Prior to Visit  Medication Sig Dispense Refill  . ferrous sulfate 325 (65 FE) MG EC tablet Take 1 tablet (325 mg total) by mouth 2 (two) times daily with a meal. 60 tablet 2  . hydrOXYzine (ATARAX/VISTARIL) 10 MG tablet TAKE 1 TABLET (10 MG TOTAL) BY MOUTH 2 (TWO) TIMES DAILY AS NEEDED FOR ANXIETY. 180 tablet 1   No current facility-administered medications on file prior to visit.    Social History   Tobacco Use  . Smoking status: Never Smoker  . Smokeless tobacco: Never Used  Substance Use Topics  . Alcohol use: No  . Drug  use: No    Review of Systems  Constitutional: Negative for chills, fever and unexpected weight change.  Respiratory: Negative for cough.   Cardiovascular: Negative for chest pain and palpitations.  Gastrointestinal: Negative for nausea and vomiting.  Psychiatric/Behavioral: Positive for sleep disturbance. Negative for self-injury and suicidal ideas. The patient is nervous/anxious.       Objective:    BP 116/72   Pulse 98   Temp 97.9 F (36.6 C) (Temporal)   Ht 5' 2.25" (1.581 m)   Wt 126 lb 3.2 oz (57.2 kg)   SpO2 98%   BMI 22.90 kg/m  BP  Readings from Last 3 Encounters:  02/23/20 116/72  01/20/20 98/64  01/16/20 (!) 134/96   Wt Readings from Last 3 Encounters:  02/23/20 126 lb 3.2 oz (57.2 kg)  01/20/20 127 lb 3.2 oz (57.7 kg)  01/12/20 138 lb 14.2 oz (63 kg)    Physical Exam Vitals reviewed.  Constitutional:      Appearance: She is well-developed.  Eyes:     Conjunctiva/sclera: Conjunctivae normal.  Cardiovascular:     Rate and Rhythm: Normal rate and regular rhythm.     Pulses: Normal pulses.     Heart sounds: Normal heart sounds.  Pulmonary:     Effort: Pulmonary effort is normal.     Breath sounds: Normal breath sounds. No wheezing, rhonchi or rales.  Skin:    General: Skin is warm and dry.  Neurological:     Mental Status: She is alert.  Psychiatric:        Speech: Speech normal.        Behavior: Behavior normal.        Thought Content: Thought content normal.        Assessment & Plan:   Problem List Items Addressed This Visit      Other   Anxiety and depression    Great productive conversation with patient today in particular since interpreter was present.  She did not appear to have symptoms of mania.  I think patient just did not understand the feelings that she was experiencing which I perceive as generalized anxiety disorder, likely exacerbated by insomnia and stress of raising a family.  Some concern of mental illness in the family in particular as she describes grandmother.  We discussed in detail patient's concerns as to why she is hesitant to take medication.   She does not understand why she is never needed medication before and now needing medication.   We talked about her grandmother at length.  I went through medications in great deal including trazodone and Atarax explaining reasons for their use, their safety profile, side effects.  Also explained and reassured her that it may not mean that she needs medications the entirety of her life it may just be that she needs medications for now.   Patient was very agreeable and appropriate in our conversation.  I felt that it was very productive.  She was agreeable to a trial increase of trazodone as she has felt it was helpful.  She understands the reason for the Atarax and why and when to take this medication.  We discussed counseling, particularly as it relates to her relationship with her children, spouse, I think this will be helpful.  We also discussed psychiatry and agreed to discuss this again when she comes back for follow-up.  We will work to facilitate the interpreter going with patient data counseling or psychiatry      Relevant Medications  traZODone (DESYREL) 50 MG tablet    Other Visit Diagnoses    Insomnia, unspecified type    -  Primary   Relevant Medications   traZODone (DESYREL) 50 MG tablet       I have changed Catheleen Stanhope's traZODone. I am also having her maintain her ferrous sulfate and hydrOXYzine.   Meds ordered this encounter  Medications  . traZODone (DESYREL) 50 MG tablet    Sig: Take 2 tablets (100 mg total) by mouth at bedtime.    Dispense:  60 tablet    Refill:  2    Order Specific Question:   Supervising Provider    Answer:   Crecencio Mc [[id number]]    Return precautions given.   Risks, benefits, and alternatives of the medications and treatment plan prescribed today were discussed, and patient expressed understanding.   Education regarding symptom management and diagnosis given to patient on AVS.  Continue to follow with Burnard Hawthorne, FNP for routine health maintenance.   Reinaldo Berber and I agreed with plan.   Mable Paris, FNP

## 2020-02-23 NOTE — Telephone Encounter (Signed)
This was noted on 05/04: Called via Mandarin interpreterIan [phone]  No VM set up. Unable to leave message.   Rejection Reason - Patient did not respond - Unable to reach patient to schedule " [facility] said 13 minutes ago  "Called pt 4/23 lvm " [facility] said about 1 month ago

## 2020-02-23 NOTE — Assessment & Plan Note (Signed)
Great productive conversation with patient today in particular since interpreter was present.  She did not appear to have symptoms of mania.  I think patient just did not understand the feelings that she was experiencing which I perceive as generalized anxiety disorder, likely exacerbated by insomnia and stress of raising a family.  Some concern of mental illness in the family in particular as she describes grandmother.  We discussed in detail patient's concerns as to why she is hesitant to take medication.   She does not understand why she is never needed medication before and now needing medication.   We talked about her grandmother at length.  I went through medications in great deal including trazodone and Atarax explaining reasons for their use, their safety profile, side effects.  Also explained and reassured her that it may not mean that she needs medications the entirety of her life it may just be that she needs medications for now.  Patient was very agreeable and appropriate in our conversation.  I felt that it was very productive.  She was agreeable to a trial increase of trazodone as she has felt it was helpful.  She understands the reason for the Atarax and why and when to take this medication.  We discussed counseling, particularly as it relates to her relationship with her children, spouse, I think this will be helpful.  We also discussed psychiatry and agreed to discuss this again when she comes back for follow-up.  We will work to facilitate the interpreter going with patient data counseling or psychiatry

## 2020-02-23 NOTE — Telephone Encounter (Signed)
Left msg on [facility] vm to call ofc.

## 2020-02-23 NOTE — Patient Instructions (Addendum)
Trial of trazodone 100mg  , take this every night.   See you in one to two weeks!

## 2020-02-25 ENCOUNTER — Telehealth: Payer: Self-pay

## 2020-02-25 NOTE — Telephone Encounter (Signed)
Marissa Walsh transferred me a call from patient at approximately 10:05am. Patient was upset because she felt that atarax & trazodone was not helping her. She said that she could not sleep & turn off her racing thoughts. She said that she only slept from 1-4am. She did take trazodone increased dose for last two days. I advised that she may need some time for this medication to build up in her system & to feel the full effects. The patient stated that the only thing that helped her calm down was holding her youngest son & rocking with him until 6am. She said that at that moment she was calm & then she said that was the wrong word that she was silent? 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 Her husband had gone to pick up their employees. Patient started getting increasingly upset & crying especially when talking about her boys. She said she did not want them to think their mom was crazy & that she needed to be strong. She kept saying that she was supposed to be strong & used to be strong. She said that she had a good husband, but husband said the only one that could help her was herself. I explained to patient that sometimes we don't have the tools to help ourselves & sometimes we have hormonal or chemical imbalances. I asked her to let me check on her referral to psychiatry. Patient was just crying & said that she did not think anyone could help her or that anything could help her. I tried to encourage her that this was not true & she just needed to find the right help or possible medications. She said that she did not have any thoughts today of hurting herself or anyone, but she did say when she talks to family or friends in [place] or here in the states, they are afraid for her. She said that husband stays awake with her because he is afraid for her. She said not that she will hurt herself though. Since patient was so upset I asked Marissa Walsh to go get Marissa Walsh for me. Marissa Walsh came over & was  listening to the conversation. Pt confirmed that she was alone cleaning the restaurant, but did not have active thoughts of hurting herself. I asked since husband was not on D[facility] if we could have permission to contact her husband to advise him to take her to [facility]. Marissa Walsh called husband to give him information & when she came back asked me to stay on the phone with patient until he came. Patient became increasing slow. She was barely speaking & was sluggish sounding. She told me at first that she took 3 atarax before she called our office then said that it was only one. I could barely get her to talk at this point & she said that she was falling asleep. I asked her was she sitting down in the restaurant & she said that she was laying down in their office. Then patient told me when I asked again that she only took 1.5 atarax. At this time patient's husband arrived & he picked up the phone. I was trying to give him info to [facility], but there is such a language barrier it was hard. I felt like patient's husband was not listening to what I was saying, so I let him speak back to [name] When Marissa Walsh picked up she could hear patient moaning. At this time we really feared OD. I ws instructed to call  911, which I did & had them dispatched to check on patient. I let them know of OD concern, but when husband as well as patient was told EMS was on the way she seemed to perk back up. Patient became a different person, even though we explained to her that her safety was our main concern. We had to send EMS to lay eyes on her & make sure there was no OD or concern of self harm. Pt was upset by this, but EMS did arrive. Marissa Walsh again advised patient that she needed to be seen by someone & asked if she would at least go to [place] to speak with someone. Since police & EMS were there call was ended.

## 2020-02-26 ENCOUNTER — Encounter: Payer: Self-pay | Admitting: Emergency Medicine

## 2020-02-26 ENCOUNTER — Emergency Department: Payer: BLUE CROSS/BLUE SHIELD

## 2020-02-26 ENCOUNTER — Other Ambulatory Visit: Payer: Self-pay

## 2020-02-26 ENCOUNTER — Emergency Department
Admission: EM | Admit: 2020-02-26 | Discharge: 2020-02-27 | Disposition: A | Discharge status: Home / Self Care | Payer: BLUE CROSS/BLUE SHIELD | Attending: Emergency Medicine | Admitting: Emergency Medicine

## 2020-02-26 DIAGNOSIS — Z046 Encounter for general psychiatric examination, requested by authority: Secondary | ICD-10-CM | POA: Insufficient documentation

## 2020-02-26 DIAGNOSIS — T887XXA Unspecified adverse effect of drug or medicament, initial encounter: Secondary | ICD-10-CM | POA: Diagnosis not present

## 2020-02-26 DIAGNOSIS — J45909 Unspecified asthma, uncomplicated: Secondary | ICD-10-CM | POA: Diagnosis not present

## 2020-02-26 DIAGNOSIS — Z79899 Other long term (current) drug therapy: Secondary | ICD-10-CM | POA: Diagnosis not present

## 2020-02-26 DIAGNOSIS — F29 Unspecified psychosis not due to a substance or known physiological condition: Secondary | ICD-10-CM | POA: Diagnosis not present

## 2020-02-26 DIAGNOSIS — G4709 Other insomnia: Secondary | ICD-10-CM | POA: Diagnosis not present

## 2020-02-26 DIAGNOSIS — Z72820 Sleep deprivation: Secondary | ICD-10-CM | POA: Insufficient documentation

## 2020-02-26 DIAGNOSIS — G4700 Insomnia, unspecified: Secondary | ICD-10-CM | POA: Diagnosis present

## 2020-02-26 LAB — COMPREHENSIVE METABOLIC PANEL
ALT: 18 U/L (ref 0–44)
AST: 14 U/L — ABNORMAL LOW (ref 15–41)
Albumin: 3.9 g/dL (ref 3.5–5.0)
Alkaline Phosphatase: 43 U/L (ref 38–126)
Anion gap: 8 (ref 5–15)
BUN: 9 mg/dL (ref 6–20)
CO2: 25 mmol/L (ref 22–32)
Calcium: 8.6 mg/dL — ABNORMAL LOW (ref 8.9–10.3)
Chloride: 101 mmol/L (ref 98–111)
Creatinine, Ser: 0.41 mg/dL — ABNORMAL LOW (ref 0.44–1.00)
GFR calc Af Amer: >60 mL/min (ref >60)
GFR calc non Af Amer: >60 mL/min (ref >60)
Glucose, Bld: 109 mg/dL — ABNORMAL HIGH (ref 70–99)
Potassium: 3.9 mmol/L (ref 3.5–5.1)
Sodium: 134 mmol/L — ABNORMAL LOW (ref 135–145)
Total Bilirubin: 1.3 mg/dL — ABNORMAL HIGH (ref 0.3–1.2)
Total Protein: 6.6 g/dL (ref 6.5–8.1)

## 2020-02-26 LAB — CBC
HCT: 35.1 % — ABNORMAL LOW (ref 36.0–46.0)
Hemoglobin: 11.5 g/dL — ABNORMAL LOW (ref 12.0–15.0)
MCH: 22.1 pg — ABNORMAL LOW (ref 26.0–34.0)
MCHC: 32.8 g/dL (ref 30.0–36.0)
MCV: 67.5 fL — ABNORMAL LOW (ref 80.0–100.0)
Platelets: 192 10*3/uL (ref 150–400)
RBC: 5.2 MIL/uL — ABNORMAL HIGH (ref 3.87–5.11)
RDW: 16.1 % — ABNORMAL HIGH (ref 11.5–15.5)
WBC: 8 10*3/uL (ref 4.0–10.5)
nRBC: 0 % (ref 0.0–0.2)

## 2020-02-26 LAB — SALICYLATE LEVEL: Salicylate Lvl: <7 mg/dL — ABNORMAL LOW (ref 7.0–30.0)

## 2020-02-26 LAB — URINE DRUG SCREEN, QUALITATIVE (ARMC ONLY)
Amphetamines, Ur Screen: NOT DETECTED
Barbiturates, Ur Screen: NOT DETECTED
Benzodiazepine, Ur Scrn: NOT DETECTED
Cannabinoid 50 Ng, Ur ~~LOC~~: NOT DETECTED
Cocaine Metabolite,Ur ~~LOC~~: NOT DETECTED
MDMA (Ecstasy)Ur Screen: NOT DETECTED
Methadone Scn, Ur: NOT DETECTED
Opiate, Ur Screen: NOT DETECTED
Phencyclidine (PCP) Ur S: NOT DETECTED
Tricyclic, Ur Screen: NOT DETECTED

## 2020-02-26 LAB — ACETAMINOPHEN LEVEL: Acetaminophen (Tylenol), Serum: <10 ug/mL — ABNORMAL LOW (ref 10–30)

## 2020-02-26 LAB — ETHANOL: Alcohol, Ethyl (B): <10 mg/dL (ref <10)

## 2020-02-26 LAB — PREGNANCY, URINE: Preg Test, Ur: NEGATIVE

## 2020-02-26 LAB — IRON AND TIBC
Iron: 75 ug/dL (ref 28–170)
Saturation Ratios: 21 % (ref 10.4–31.8)
TIBC: 364 ug/dL (ref 250–450)
UIBC: 289 ug/dL

## 2020-02-26 MED ORDER — LORAZEPAM 1 MG PO TABS
1.0000 mg | ORAL_TABLET | Freq: Once | ORAL | Status: AC
  Administered 2020-02-26: 1 mg via ORAL
  Filled 2020-02-26: qty 1

## 2020-02-26 MED ORDER — OLANZAPINE 5 MG PO TABS
2.5000 mg | ORAL_TABLET | Freq: Four times a day (QID) | ORAL | Status: DC | PRN
  Administered 2020-02-26 – 2020-02-27 (×2): 2.5 mg via ORAL
  Filled 2020-02-26 (×2): qty 1

## 2020-02-26 NOTE — ED Notes (Signed)
Spoke with poison control.  Recommended psych labs and iron level. Dr Darnelle Catalan aware. Pt can be cleared 6 hours from ingestion at 1100 AM.

## 2020-02-26 NOTE — ED Notes (Signed)
Patient ambulated with a steady gait to room 22. Patient is lying in bed, denies any complaints. Patient gave up the rubber band in her hair and it was put with her belongings.

## 2020-02-26 NOTE — ED Notes (Signed)
Report given to Anne RN

## 2020-02-26 NOTE — ED Notes (Signed)
Patient is alert and oriented. Patient asked if her husband had visited and was told that her husband was informed of visiting hours earlier. Patient is calm and compliant with meds.

## 2020-02-26 NOTE — Telephone Encounter (Signed)
FYI  She is in ED now

## 2020-02-26 NOTE — Consult Note (Signed)
[facility] Face-to-Face Psychiatry Consult   Reason for Consult:    Symptoms psychiatric ---post covid  Referring Physician:   ED--     Patient Identification: Marissa Walsh MRN:  [id number] Principal Diagnosis: <principal problem not specified> Diagnosis:    Mood and Psychosis NOS  New onset   Total Time spent with patient:  Subjective:   Marissa Walsh is a 39 y.o. female patient admitted with behavioral and cognitive symptoms post covid injections  She has racing and pressured thoughts ---past 4-6 weeks symptoms began after --her first injection.  She had cognitive confusion --//at that time and required ICU observation.  Released and had second COVID -injection and worsening symptoms of racing thoughts occurred as well as markedly decreased need for sleep   No hallucinations auditory, visual ---gustatory olfactory---  She was frustrated and banged head to stop the racing ---now post ativan and Zyprexa times one and her thoughts are slowing   HPI:  --  As above    Past Psychiatric History: --  None    Risk to Self:  none  Risk to Others:  none Prior Inpatient Therapy:  --none Prior Outpatient Therapy:   None  Past Medical History:  Past Medical History:  Diagnosis Date  . Asthma   . Bacterial vaginosis   . Ovarian cyst   . Thalassemia   . Vulvar dermatitis    History reviewed. No pertinent surgical history. Family History:  Family History  Problem Relation Age of Onset  . Hyperlipidemia Mother   . Hypertension Mother   . Diabetes Mother   . Cancer Father 79       colon cancer   Family Psychiatric  History:--  She says her maternal grand mom may have had depression and anxiety  Her mom may have had anxiety and depression as well.   None she says    Social History:  Social History   Substance and Sexual Activity  Alcohol Use No     Social History   Substance and Sexual Activity  Drug Use No    Social History   Socioeconomic History  . Marital status:  Married    Spouse name: Not on file  . Number of children: Not on file  . Years of education: Not on file  . Highest education level: Not on file  Occupational History  . Not on file  Tobacco Use  . Smoking status: Never Smoker  . Smokeless tobacco: Never Used  Substance and Sexual Activity  . Alcohol use: No  . Drug use: No  . Sexual activity: Never  Other Topics Concern  . Not on file  Social History Narrative   Working [employer], [employer].    [employer] [place];    Has been in [place] for 2 years.    Lives with husband.   3 boys;  18, 9 and 10. no complications for pregnancies.         Social Determinants of Health   Financial Resource Strain:   . Difficulty of Paying Living Expenses:   Food Insecurity:   . Worried About Programme researcher, broadcasting/film/video in the Last Year:   . Barista in the Last Year:   Transportation Needs:   . Freight forwarder (Medical):   Marland Kitchen Lack of Transportation (Non-Medical):   Physical Activity:   . Days of Exercise per Week:   . Minutes of Exercise per Session:   Stress:   . Feeling of Stress :   Social Connections:   .  Frequency of Communication with Friends and Family:   . Frequency of Social Gatherings with Friends and Family:   . Attends Religious Services:   . Active Member of Clubs or Organizations:   . Attends Banker Meetings:   Marland Kitchen Marital Status:    Additional Social History:--  She is stressed by her three boys ---9 18 and Younger  --however she does not feel this is totally the primary problem   No other major stressors  Husband works 12 hour days in [employer]  She does not seem to have history of prodrome or series of unusual symptoms   Childhood did not have history of psychosis, social anxiety, separation anxiety or related issues   No history of developmental delays, ---BC or Learning Disabilities --   Allergies:    None noted   Allergies  Allergen Reactions  . Shellfish Allergy Itching   . Alcohol-Sulfur [Sulfur] Itching    Labs:  Results for orders placed or performed during the hospital encounter of 02/26/20 (from the past 48 hour(s))  Comprehensive metabolic panel     Status: Abnormal   Collection Time: 02/26/20  9:48 AM  Result Value Ref Range   Sodium 134 (L) 135 - 145 mmol/L   Potassium 3.9 3.5 - 5.1 mmol/L   Chloride 101 98 - 111 mmol/L   CO2 25 22 - 32 mmol/L   Glucose, Bld 109 (H) 70 - 99 mg/dL    Comment: Glucose reference range applies only to samples taken after fasting for at least 8 hours.   BUN 9 6 - 20 mg/dL   Creatinine, Ser 8.93 (L) 0.44 - 1.00 mg/dL   Calcium 8.6 (L) 8.9 - 10.3 mg/dL   Total Protein 6.6 6.5 - 8.1 g/dL   Albumin 3.9 3.5 - 5.0 g/dL   AST 14 (L) 15 - 41 U/L   ALT 18 0 - 44 U/L   Alkaline Phosphatase 43 38 - 126 U/L   Total Bilirubin 1.3 (H) 0.3 - 1.2 mg/dL   GFR calc non Af Amer >60 >60 mL/min   GFR calc Af Amer >60 >60 mL/min   Anion gap 8 5 - 15    Comment: Performed at [facility], [address], [place], [place] [address]  Ethanol     Status: None   Collection Time: 02/26/20  9:48 AM  Result Value Ref Range   Alcohol, Ethyl (B) <10 <10 mg/dL    Comment: (NOTE) Lowest detectable limit for serum alcohol is 10 mg/dL. For medical purposes only. Performed at [facility], [address] [place], [place] [address]   Salicylate level     Status: Abnormal   Collection Time: 02/26/20  9:48 AM  Result Value Ref Range   Salicylate Lvl <7.0 (L) 7.0 - 30.0 mg/dL    Comment: Performed at [facility], [address] [place], [place] [address]  Acetaminophen level     Status: Abnormal   Collection Time: 02/26/20  9:48 AM  Result Value Ref Range   Acetaminophen (Tylenol), Serum <10 (L) 10 - 30 ug/mL    Comment: (NOTE) Therapeutic concentrations vary significantly. A range of 10-30 ug/mL  may be an effective concentration for many patients. However, some  are best treated at concentrations  outside of this range. Acetaminophen concentrations >150 ug/mL at 4 hours after ingestion  and >50 ug/mL at 12 hours after ingestion are often associated with  toxic reactions. Performed at [facility], [address] [place],  [place] [address]   cbc     Status: Abnormal   Collection Time: 02/26/20  9:48 AM  Result Value Ref Range   WBC 8.0 4.0 - 10.5 K/uL   RBC 5.20 (H) 3.87 - 5.11 MIL/uL   Hemoglobin 11.5 (L) 12.0 - 15.0 g/dL   HCT 35.1 (L) 36.0 - 46.0 %   MCV 67.5 (L) 80.0 - 100.0 fL   MCH 22.1 (L) 26.0 - 34.0 pg   MCHC 32.8 30.0 - 36.0 g/dL   RDW 16.1 (H) 11.5 - 15.5 %   Platelets 192 150 - 400 K/uL   nRBC 0.0 0.0 - 0.2 %    Comment: Performed at [facility], [address] [place], [place] [address]  Iron and TIBC     Status: None   Collection Time: 02/26/20  9:48 AM  Result Value Ref Range   Iron 75 28 - 170 ug/dL   TIBC 364 250 - 450 ug/dL   Saturation Ratios 21 10.4 - 31.8 %   UIBC 289 ug/dL    Comment: Performed at [facility], [address], [place], [place] [address]  Urine Drug Screen, Qualitative     Status: None   Collection Time: 02/26/20 10:27 AM  Result Value Ref Range   Tricyclic, Ur Screen NONE DETECTED NONE DETECTED   Amphetamines, Ur Screen NONE DETECTED NONE DETECTED   MDMA (Ecstasy)Ur Screen NONE DETECTED NONE DETECTED   Cocaine Metabolite,Ur [place] NONE DETECTED NONE DETECTED   Opiate, Ur Screen NONE DETECTED NONE DETECTED   Phencyclidine (PCP) Ur S NONE DETECTED NONE DETECTED   Cannabinoid 50 Ng, Ur  NONE DETECTED NONE DETECTED   Barbiturates, Ur Screen NONE DETECTED NONE DETECTED   Benzodiazepine, Ur Scrn NONE DETECTED NONE DETECTED   Methadone Scn, Ur NONE DETECTED NONE DETECTED    Comment: (NOTE) Tricyclics + metabolites, urine    Cutoff 1000 ng/mL Amphetamines + metabolites, urine  Cutoff 1000 ng/mL MDMA (Ecstasy), urine              Cutoff 500 ng/mL Cocaine Metabolite, urine          Cutoff 300 ng/mL Opiate  + metabolites, urine        Cutoff 300 ng/mL Phencyclidine (PCP), urine         Cutoff 25 ng/mL Cannabinoid, urine                 Cutoff 50 ng/mL Barbiturates + metabolites, urine  Cutoff 200 ng/mL Benzodiazepine, urine              Cutoff 200 ng/mL Methadone, urine                   Cutoff 300 ng/mL The urine drug screen provides only a preliminary, unconfirmed analytical test result and should not be used for non-medical purposes. Clinical consideration and professional judgment should be applied to any positive drug screen result due to possible interfering substances. A more specific alternate chemical method must be used in order to obtain a confirmed analytical result. Gas chromatography / mass spectrometry (GC/MS) is the preferred confirmat ory method. Performed at [facility], [address] [place], [place] [address]   Pregnancy, urine     Status: None   Collection Time: 02/26/20 10:27 AM  Result Value Ref Range   Preg Test, Ur NEGATIVE NEGATIVE    Comment: Performed at [facility], [address] [place], [place] [address]    Current Facility-Administered Medications  Medication  Dose Route Frequency Provider Last Rate Last Admin  . OLANZapine (ZYPREXA) tablet 2.5 mg  2.5 mg Oral Q6H PRN Roselind Messier, MD       Current Outpatient Medications  Medication Sig Dispense Refill  . ferrous sulfate 325 (65 FE) MG EC tablet Take 1 tablet (325 mg total) by mouth 2 (two) times daily with a meal. 60 tablet 2  . hydrOXYzine (ATARAX/VISTARIL) 10 MG tablet TAKE 1 TABLET (10 MG TOTAL) BY MOUTH 2 (TWO) TIMES DAILY AS NEEDED FOR ANXIETY. 180 tablet 1  . traZODone (DESYREL) 50 MG tablet Take 2 tablets (100 mg total) by mouth at bedtime. 60 tablet 2    Musculoskeletal: Strength & Muscle Tone:  None noted   Gait & Station: --unsteady initially then okay Patient leans:  To left   Psychiatric Specialty Exam: Physical Exam  Review of Systems  Blood pressure  114/80, pulse 76, temperature 98.6 F (37 C), temperature source Oral, resp. rate 16, SpO2 97 %.There is no height or weight on file to calculate BMI.      Alert cooperative oriented to person place and time   Not clouded --or fluctuant --/   Cognitive  Confusion though waxes and wanes  Wanes   Mood depressed /anxious  Affect ---depressed   Thought content no frank delusions, hallucinations Thought process -she says she has markedly speeded thoughts that do not slow down   Memory ---cannot fully assess she is too sleepy and distraught No active SI HI or plans  Judgement insight reliability okay  Appearance normal  Bilateral arms somewhat ruddy --post injection sites (diffuse slight ruddiness)  Speech normal rate tone volume fluency ---but thoughts are speeded she says   Fund of knowledge and intelligence normal   No tics or movements issues no shakes and tremors   Abstraction/ normal   concentration and attention impaired to some degree due to racing thoughts                                       ADL's:  More challenging --with speeded thoughts    Cognition:  Diffuse impairment began and she is post ICU    Sleep:   decreased sleep for all three phases       Treatment Plan Summary: [place] female with above issues with unclear origin of mood and psychosis first time and began with Covid injections, receiving meds at this time /returned her post cogntive impairment / from ICU --with some recovery but now racing thoughts more after second COVID injection May 7  Disposition:   Remains in ER for observation and treatment --Possibly needs to consult neurology as well ----and continue meds with closer follow up     Roselind Messier, MD 02/26/2020 3:10 PM

## 2020-02-26 NOTE — ED Notes (Signed)
Patient has Pink fleece jacket, grey and black leggings, navy long t shirt, pink flat shoes, blue bra, grey underwear dressed out into wine scrubs.

## 2020-02-26 NOTE — ED Notes (Signed)
Patient was given telephone to call her husband. Patient remains alert and oriented and able to hold complex conversation.

## 2020-02-26 NOTE — ED Notes (Signed)
Pt sitting in car with family, updated and will call when bed ready

## 2020-02-26 NOTE — ED Notes (Signed)
Pt changed into red scrubs, belongings secured in pt belongings bag in nurses station. Empty trazodone bottle placed in bag.

## 2020-02-26 NOTE — ED Notes (Signed)
VOL, pend further eval

## 2020-02-26 NOTE — ED Triage Notes (Addendum)
Patient ambulatory to triage with steady gait, without difficulty or distress noted, st unable to sleep for last 2 days; insomnia unrelieved by trazadone; pt is accomp by husband who st will wait outside until exam room available; pt st may go home now but husb st will try and wait

## 2020-02-26 NOTE — ED Notes (Signed)
Pt yelling and crying out. Hitting head on wall. Dr Darnelle Catalan notified.

## 2020-02-26 NOTE — BH Assessment (Signed)
Assessment Note  Marissa Walsh is an 39 y.o. female who presents to the ER via her husband due to an increase of racing thoughts and not being able to sleep for approximately four days. Per the patient, this happened several weeks ago when she received her first vaccine for COVID-19 vaccine and currently happening after she received the second injection. She states, prior to now, she has had no history of mental health concerns or problems. 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 Thus, she don't believe that is major trigger for her current mental and emotional state.  During the interview, the patient was calm, cooperative and pleasant. She was able to provide appropriate answers to the questions. However, prior to been seen by this Clinical research associate, she received medications to help with her behaviors. Upon arrival to the ER, patient was mumbling and making  nonsensical sounds. At one point, she was hitting her head on the wall. When asked about it, she said the thoughts were getting her upset because they keep coming at her fast.  Diagnosis: Unspecified Psychosis   Past Medical History:  Past Medical History:  Diagnosis Date  . Asthma   . Bacterial vaginosis   . Ovarian cyst   . Thalassemia   . Vulvar dermatitis     History reviewed. No pertinent surgical history.  Family History:  Family History  Problem Relation Age of Onset  . Hyperlipidemia Mother   . Hypertension Mother   . Diabetes Mother   . Cancer Father 29       colon cancer    Social History:  reports that she has never smoked. She has never used smokeless tobacco. She reports that she does not drink alcohol or use drugs.  Additional Social History:  Alcohol / Drug Use Pain Medications: See PTA Prescriptions: See PTA Over the Counter: See PTA History of alcohol / drug use?: No history of alcohol / drug abuse Longest period of sobriety (when/how  long): n/a  CIWA: CIWA-Ar BP: 114/80 Pulse Rate: 76 COWS:    Allergies:  Allergies  Allergen Reactions  . Shellfish Allergy Itching  . Alcohol-Sulfur [Sulfur] Itching    Home Medications: (Not in a hospital admission)   OB/GYN Status:  No LMP recorded.  General Assessment Data Location of Assessment: [facility] ED TTS Assessment: In system Is this a Tele or Face-to-Face Assessment?: Face-to-Face Is this an Initial Assessment or a Re-assessment for this encounter?: Initial Assessment Patient Accompanied by:: Other(Husband) Language Other than English: No Living Arrangements: Other (Comment) What gender do you identify as?: Female Marital status: Married Pregnancy Status: No Living Arrangements: Spouse/significant other Can pt return to current living arrangement?: Yes Admission Status: Voluntary Is patient capable of signing voluntary admission?: Yes Referral Source: Self/Family/Friend Insurance type: Scientist, research (physical sciences) Exam [facility] Walk-in ONLY) Medical Exam completed: Yes  Crisis Care Plan Living Arrangements: Spouse/significant other Legal Guardian: Other:(Self) Name of Psychiatrist: Reports of none Name of Therapist: Reports of none  Education Status Is patient currently in school?: No Is the patient employed, unemployed or receiving disability?: Employed  Risk to self with the past 6 months Suicidal Ideation: No Has patient been a risk to self within the past 6 months prior to admission? : No Suicidal Intent: No Has patient had any suicidal intent within the past 6 months prior to admission? : No Is patient at risk for suicide?: No Suicidal Plan?: No Has patient had  any suicidal plan within the past 6 months prior to admission? : No Access to Means: No What has been your use of drugs/alcohol within the last 12 months?: Reports of none Previous Attempts/Gestures: No Other Self Harm Risks: Reports of none Triggers for Past Attempts: None known Intentional  Self Injurious Behavior: None Family Suicide History: Unknown Recent stressful life event(s): Other (Comment) Persecutory voices/beliefs?: No Depression: No Depression Symptoms: Insomnia Substance abuse history and/or treatment for substance abuse?: No Suicide prevention information given to non-admitted patients: Not applicable  Risk to Others within the past 6 months Homicidal Ideation: No Does patient have any lifetime risk of violence toward others beyond the six months prior to admission? : No Thoughts of Harm to Others: No Current Homicidal Intent: No Current Homicidal Plan: No Access to Homicidal Means: No Identified Victim: Reports of none History of harm to others?: No Assessment of Violence: None Noted Violent Behavior Description: Reports of none Does patient have access to weapons?: No Does patient have a court date: No Is patient on probation?: No  Psychosis Hallucinations: None noted Delusions: None noted  Mental Status Report Appearance/Hygiene: Unremarkable, In scrubs Eye Contact: Fair Motor Activity: Freedom of movement, Unremarkable Speech: Logical/coherent Level of Consciousness: Alert Mood: Anxious, Pleasant Affect: Anxious, Frightened Anxiety Level: Moderate Thought Processes: Coherent, Relevant Judgement: Partial Orientation: Person, Place, Time, Situation, Appropriate for developmental age Obsessive Compulsive Thoughts/Behaviors: Minimal  Cognitive Functioning Concentration: Decreased Memory: Recent Intact, Remote Intact Is patient IDD: No Insight: Fair Impulse Control: Fair Appetite: Good Have you had any weight changes? : No Change Sleep: Decreased Total Hours of Sleep: 0 Vegetative Symptoms: None  ADLScreening [facility] Assessment Services) Patient's cognitive ability adequate to safely complete daily activities?: Yes Patient able to express need for assistance with ADLs?: Yes Independently performs ADLs?: Yes (appropriate for  developmental age)  Prior Inpatient Therapy Prior Inpatient Therapy: No  Prior Outpatient Therapy Prior Outpatient Therapy: No Does patient have an ACCT team?: No Does patient have Intensive In-House Services?  : No Does patient have [facility] services? : No Does patient have P4CC services?: No  ADL Screening (condition at time of admission) Patient's cognitive ability adequate to safely complete daily activities?: Yes Is the patient deaf or have difficulty hearing?: No Does the patient have difficulty seeing, even when wearing glasses/contacts?: No Does the patient have difficulty concentrating, remembering, or making decisions?: No Patient able to express need for assistance with ADLs?: Yes Does the patient have difficulty dressing or bathing?: No Independently performs ADLs?: Yes (appropriate for developmental age) Does the patient have difficulty walking or climbing stairs?: No Weakness of Legs: None Weakness of Arms/Hands: None  Home Assistive Devices/Equipment Home Assistive Devices/Equipment: None  Therapy Consults (therapy consults require a physician order) PT Evaluation Needed: No OT Evalulation Needed: No SLP Evaluation Needed: No Abuse/Neglect Assessment (Assessment to be complete while patient is alone) Abuse/Neglect Assessment Can Be Completed: Yes Physical Abuse: Denies Verbal Abuse: Denies Sexual Abuse: Denies Exploitation of patient/patient's resources: Denies Self-Neglect: Denies Values / Beliefs Cultural Requests During Hospitalization: None Spiritual Requests During Hospitalization: None Consults Spiritual Care Consult Needed: No Transition of Care Team Consult Needed: No Advance Directives (For Healthcare) Does Patient Have a Medical Advance Directive?: No Would patient like information on creating a medical advance directive?: No - Patient declined  Child/Adolescent Assessment Running Away Risk: Denies  Disposition:  Disposition Initial  Assessment Completed for this Encounter: Yes  On Site Evaluation by:   Reviewed with Physician:    Roetta Sessions.  Tammi Klippel MS, LCAS, [facility], [facility] Therapeutic Triage Specialist 02/26/2020 6:22 PM

## 2020-02-26 NOTE — ED Provider Notes (Signed)
[facility] Emergency Department Provider Note   ____________________________________________   First MD Initiated Contact with Patient 02/26/20 0945     (approximate)  I have reviewed the triage vital signs and the nursing notes.   HISTORY  Chief Complaint Insomnia   HPI Marissa Walsh is a 39 y.o. female who comes in with her husband.  They reports she had a Covid vaccination and several days later began having problems with racing thoughts and then insomnia.  They report that this led to altered mental status which she was admitted for last month.  She then got her booster for her Covid vaccination and began having racing thoughts again.  She can no longer control her racing thoughts and she has not been sleeping well for the last 4 days.  She gets 2 hours of sleep at night and may be 1 or 2 half hour naps during the day.  She can speak clearly and answer questions clearly she follows commands clearly but says she cannot control her racing thoughts.  They cannot tell me exactly when the Covid vaccination was initially the second 1 we think was April 28 or so.  Although the patient herself ultimately was may eighth.  Patient is not having any pain or nausea or blurry vision or numbness or weakness.  Just the racing thoughts and unable to sleep.  She has a prescription for trazodone and Atarax to help her sleep and last night she took several doses of the trazodone twice.  Poison control recommends observation.         Past Medical History:  Diagnosis Date  . Asthma   . Bacterial vaginosis   . Ovarian cyst   . Thalassemia   . Vulvar dermatitis     Patient Active Problem List   Diagnosis Date Noted  . Anxiety and depression 01/20/2020  . Acute metabolic encephalopathy 93/81/8299  . Confusion 01/12/2020  . Flu-like symptoms 11/02/2018  . Vitamin D deficiency 10/25/2017  . Cyst of left ovary 04/08/2017  . Mild single current episode of major depressive  disorder ([place]) 10/25/2016  . Anemia 07/12/2016  . Routine general medical examination at a health care facility 05/08/2016  . Vaginal itching 05/08/2016    History reviewed. No pertinent surgical history.  Prior to Admission medications   Medication Sig Start Date End Date Taking? Authorizing Provider  ferrous sulfate 325 (65 FE) MG EC tablet Take 1 tablet (325 mg total) by mouth 2 (two) times daily with a meal. 02/16/20   Burnard Hawthorne, FNP  hydrOXYzine (ATARAX/VISTARIL) 10 MG tablet TAKE 1 TABLET (10 MG TOTAL) BY MOUTH 2 (TWO) TIMES DAILY AS NEEDED FOR ANXIETY. 02/19/20   Burnard Hawthorne, FNP  traZODone (DESYREL) 50 MG tablet Take 2 tablets (100 mg total) by mouth at bedtime. 02/23/20   Burnard Hawthorne, FNP    Allergies Shellfish allergy and Alcohol-sulfur [sulfur]  Family History  Problem Relation Age of Onset  . Hyperlipidemia Mother   . Hypertension Mother   . Diabetes Mother   . Cancer Father 75       colon cancer    Social History Social History   Tobacco Use  . Smoking status: Never Smoker  . Smokeless tobacco: Never Used  Substance Use Topics  . Alcohol use: No  . Drug use: No    Review of Systems  Constitutional: No fever/chills Eyes: No visual changes. ENT: No sore throat. Cardiovascular: Denies chest pain. Respiratory: Denies shortness of breath. Gastrointestinal:  No abdominal pain.  No nausea, no vomiting.  No diarrhea.  No constipation. Genitourinary: Negative for dysuria. Musculoskeletal: Negative for back pain. Skin: Negative for rash. Neurological: Negative for headaches, focal weakness   ____________________________________________   PHYSICAL EXAM:  VITAL SIGNS: ED Triage Vitals  Enc Vitals Group     BP 02/26/20 0343 132/90     Pulse Rate 02/26/20 0343 77     Resp 02/26/20 0343 18     Temp 02/26/20 0343 98.6 F (37 C)     Temp Source 02/26/20 0343 Oral     SpO2 02/26/20 0343 97 %     Weight --      Height --      Head  Circumference --      Peak Flow --      Pain Score 02/26/20 0347 0     Pain Loc --      Pain Edu? --      Excl. in GC? --     Constitutional: Alert and oriented.  Patient has episodes of moaning Eyes: Conjunctivae are normal. PER. EOMI. Head: Atraumatic. Nose: No congestion/rhinnorhea. Mouth/Throat: Mucous membranes are moist.  Oropharynx non-erythematous. Neck: No stridor.   Cardiovascular: Normal rate, regular rhythm. Grossly normal heart sounds.  Good peripheral circulation. Respiratory: Normal respiratory effort.  No retractions. Lungs CTAB. Gastrointestinal: Soft and nontender. No distention. No abdominal bruits. No CVA tenderness. Musculoskeletal: No lower extremity tenderness nor edema.  Neurologic:  Normal speech and language. No gross focal neurologic deficits are appreciated.  Cranial nerves II through XII appear to be intact motor is 5/5 throughout finger-to-nose is normal patient does not report any numbness Skin:  Skin is warm, dry and intact. No rash noted.  ____________________________________________   LABS (all labs ordered are listed, but only abnormal results are displayed)  Labs Reviewed  COMPREHENSIVE METABOLIC PANEL - Abnormal; Notable for the following components:      Result Value   Sodium 134 (*)    Glucose, Bld 109 (*)    Creatinine, Ser 0.41 (*)    Calcium 8.6 (*)    AST 14 (*)    Total Bilirubin 1.3 (*)    All other components within normal limits  SALICYLATE LEVEL - Abnormal; Notable for the following components:   Salicylate Lvl <7.0 (*)    All other components within normal limits  ACETAMINOPHEN LEVEL - Abnormal; Notable for the following components:   Acetaminophen (Tylenol), Serum <10 (*)    All other components within normal limits  CBC - Abnormal; Notable for the following components:   RBC 5.20 (*)    Hemoglobin 11.5 (*)    HCT 35.1 (*)    MCV 67.5 (*)    MCH 22.1 (*)    RDW 16.1 (*)    All other components within normal limits    ETHANOL  URINE DRUG SCREEN, QUALITATIVE ([facility] ONLY)  IRON AND TIBC  PREGNANCY, URINE   ____________________________________________  EKG  EKG read interpreted by me shows normal sinus rhythm rate of 75 normal axis essentially normal EKG ____________________________________________  RADIOLOGY  ED MD interpretation: KUB reviewed by me shows no evidence of radiopaque foreign bodies.  Poison control had been worried that she may have taken extra iron pills as well.  There is no evidence of this.  Official radiology report(s): DG Abdomen 1 View  Result Date: 02/26/2020 CLINICAL DATA:  Overdose. EXAM: ABDOMEN - 1 VIEW COMPARISON:  None. FINDINGS: The bowel gas pattern is normal. No radio-opaque calculi or  other significant radiographic abnormality are seen. IMPRESSION: Negative. Electronically Signed   By: Kennith Center M.D.   On: 02/26/2020 11:23    ____________________________________________   PROCEDURES  Procedure(s) performed (including Critical Care):  Procedures   ____________________________________________   INITIAL IMPRESSION / ASSESSMENT AND PLAN / ED COURSE  Patient with racing thoughts and episodic alterations in mental status where she just lays and moans.  Then she began banging her head on the wall.  I gave her a milligram of Ativan in an attempt to sedate her without putting her to sleep so this I could still evaluate her.    ----------------------------------------- 4:05 PM on 02/26/2020 -----------------------------------------  Patient is now doing better.  Discussed the patient with Dr. Derrill Kay were not sure what would have neurology see her for but we will watch her for a little bit longer make sure she is doing okay possibly outpatient neurology follow-up and some Ativan to go home with.  Dr. Derrill Kay will check on her again in a few hours.         ____________________________________________   FINAL CLINICAL IMPRESSION(S) / ED DIAGNOSES  Final  diagnoses:  Non-dose-related adverse effect of medication, initial encounter     ED Discharge Orders    None       Note:  This document was prepared using Dragon voice recognition software and may include unintentional dictation errors.    Arnaldo Natal, MD 02/26/20 1606

## 2020-02-27 DIAGNOSIS — G4709 Other insomnia: Secondary | ICD-10-CM

## 2020-02-27 DIAGNOSIS — T887XXA Unspecified adverse effect of drug or medicament, initial encounter: Secondary | ICD-10-CM

## 2020-02-27 MED ORDER — QUETIAPINE FUMARATE 25 MG PO TABS: 25.0000 mg | ORAL_TABLET | Freq: Every day | ORAL | 0 refills | Status: DC

## 2020-02-27 MED ORDER — OLANZAPINE 5 MG PO TBDP
2.5000 mg | ORAL_TABLET | Freq: Two times a day (BID) | ORAL | Status: DC
  Administered 2020-02-27: 2.5 mg via ORAL

## 2020-02-27 NOTE — ED Notes (Signed)
Breakfast provided.

## 2020-02-27 NOTE — BH Assessment (Signed)
Writer spoke with the patient to complete an updated/reassessment. Patient denies SI/HI and AV/H.  Patient does not meet inpatient criteria. 

## 2020-02-27 NOTE — ED Provider Notes (Signed)
The patient has been placed in psychiatric observation due to the need to provide a safe environment for the patient while obtaining psychiatric consultation and evaluation, as well as ongoing medical and medication management to treat the patient's condition.  The patient has not been placed under full IVC at this time.    Marissa Walsh, [place], MD 02/27/20 [phone]

## 2020-02-27 NOTE — ED Notes (Signed)
Significant other to visit, copy of phone hours and visiting hours provided. Will come back during visiting hours. Patients continues to sleep. Will monitor. awaiting psych eval.

## 2020-02-27 NOTE — Consult Note (Addendum)
[facility] Psych ED Discharge  02/27/2020 12:46 PM Marissa Walsh  MRN:  [id number] Principal Problem: <principal problem not specified> Discharge Diagnoses: Active Problems:   * No active hospital problems. Marissa Walsh, 39 y.o., female patient seen by this provider and Marissa Eaves, NP for face to face assessment.  Chart reviewed and consulted with Dr. Jola Walsh on 02/27/20.  On evaluation Marissa Walsh reports, " I am here for racing thoughts" and points to her head.    She has some language barriers as English is not her primary language but is appropriately able to communicate her concerns and makes her needs known in a clear and coherent fashion.  She correlates most of the information previously obtained in her [facility] admission assessment. She relates non-specific racing thoughts that interfere with her sleep and decreased appetite that started a few weeks prior after receiving her second COVID 19 immunization.  She also reports similar concerns with her initial vaccination that spontaneously resolved within a few weeks.  It is unclear if her symptoms are related to the vaccine, other non-related medical pathology or if mounting psychosocial stressors may have contributed to her current concerns.  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 She states she feels better today after sleeping well overnight and desires to go home today. She has not had suicidal or homicidal ideations and is not experiencing audible or visual hallucinations.    During the assessment there is no sense the she is delusional and no paranoid behaviors seen.   She denies previous mental health diagnosis, previous inpatient psychiatric hospitalization.  She denies use of alcohol or illicit substances and her urine drug screen was negative. She is not currently followed by outpatient mental health services and with the exception of hydroxyzine and trazodone that was recently prescribed by her  primary care provider for anxiety related to the racing thoughts.  Essentially, she is psychotropic medication naive.  She has a history for thalassemia and reports taking daily iron supplements, managed by her PCP.    During evaluation Marissa Walsh is seated on the bed with her hands folded in her lap. She is alert/oriented x 4; calm/cooperative; and mood congruent with affect.  Patient is speaking in a clear tone at moderate volume, and normal pace; with good eye contact.  Her thought process is coherent and relevant; There is no indication that she is currently responding to internal/external stimuli or experiencing delusional thought content.  Patient denies suicidal/self-harm/homicidal ideation, psychosis, and paranoia.  Patient has remained calm throughout assessment and has answered questions appropriately.    Total Time spent with patient: 30 minutes  Past Psychiatric History: no known psychiatric history  Past Medical History:  Past Medical History:  Diagnosis Date  . Asthma   . Bacterial vaginosis   . Ovarian cyst   . Thalassemia   . Vulvar dermatitis    History reviewed. No pertinent surgical history. Family History:  Family History  Problem Relation Age of Onset  . Hyperlipidemia Mother   . Hypertension Mother   . Diabetes Mother   . Cancer Father 26       colon cancer   Family Psychiatric  History: unknown Social History:  Social History   Substance and Sexual Activity  Alcohol Use No     Social History   Substance and Sexual Activity  Drug Use No    Social History   Socioeconomic History  . Marital status: Married  Spouse name: Not on file  . Number of children: Not on file  . Years of education: Not on file  . Highest education level: Not on file  Occupational History  . Not on file  Tobacco Use  . Smoking status: Never Smoker  . Smokeless tobacco: Never Used  Substance and Sexual Activity  . Alcohol use: No  . Drug use: No  . Sexual activity: Never   Other Topics Concern  . Not on file  Social History Narrative   Working [employer], [employer].    Mandarin [place];    Has been in [place] for 2 years.    Lives with husband.   3 boys;  18, 9 and 10. no complications for pregnancies.         Social Determinants of Health   Financial Resource Strain:   . Difficulty of Paying Living Expenses:   Food Insecurity:   . Worried About Programme researcher, broadcasting/film/video in the Last Year:   . Barista in the Last Year:   Transportation Needs:   . Freight forwarder (Medical):   Marland Kitchen Lack of Transportation (Non-Medical):   Physical Activity:   . Days of Exercise per Week:   . Minutes of Exercise per Session:   Stress:   . Feeling of Stress :   Social Connections:   . Frequency of Communication with Friends and Family:   . Frequency of Social Gatherings with Friends and Family:   . Attends Religious Services:   . Active Member of Clubs or Organizations:   . Attends Banker Meetings:   Marland Kitchen Marital Status:     Has this patient used any form of tobacco in the last 30 days? (Cigarettes, Smokeless Tobacco, Cigars, and/or Pipes) Prescription not provided because: patient does not smoke  Current Medications: Current Facility-Administered Medications  Medication Dose Route Frequency Provider Last Rate Last Admin  . OLANZapine (ZYPREXA) tablet 2.5 mg  2.5 mg Oral Q6H PRN Marissa Messier, MD   2.5 mg at 02/27/20 1039  . OLANZapine zydis (ZYPREXA) disintegrating tablet 2.5 mg  2.5 mg Oral BID Marissa Amos, FNP   2.5 mg at 02/27/20 1041   Current Outpatient Medications  Medication Sig Dispense Refill  . ferrous sulfate 325 (65 FE) MG EC tablet Take 1 tablet (325 mg total) by mouth 2 (two) times daily with a meal. 60 tablet 2  . hydrOXYzine (ATARAX/VISTARIL) 10 MG tablet TAKE 1 TABLET (10 MG TOTAL) BY MOUTH 2 (TWO) TIMES DAILY AS NEEDED FOR ANXIETY. 180 tablet 1  . traZODone (DESYREL) 50 MG tablet Take 2 tablets (100 mg  total) by mouth at bedtime. 60 tablet 2   PTA Medications: (Not in a hospital admission)   Musculoskeletal: Strength & Muscle Tone: within normal limits Gait & Station: normal Patient leans: N/A  Psychiatric Specialty Exam: Physical Exam Constitutional:      Appearance: Normal appearance.  Cardiovascular:     Rate and Rhythm: Normal rate.  Pulmonary:     Effort: Pulmonary effort is normal.  Musculoskeletal:        General: Normal range of motion.     Cervical back: Normal range of motion.  Neurological:     Mental Status: She is alert.  Psychiatric:        Mood and Affect: Mood normal.        Behavior: Behavior normal.        Thought Content: Thought content normal.     Review of  Systems  HENT: Negative.   Respiratory: Negative.   Cardiovascular: Negative.   Musculoskeletal: Negative.   Psychiatric/Behavioral: Positive for sleep disturbance (improved since admission). Negative for agitation, behavioral problems, confusion, decreased concentration, dysphoric mood, hallucinations, self-injury and suicidal ideas. The patient is nervous/anxious (improved since admission). The patient is not hyperactive.     Blood pressure 118/62, pulse 84, temperature 98.6 F (37 C), temperature source Oral, resp. rate 18, SpO2 100 %.There is no height or weight on file to calculate BMI.  General Appearance: Casual  Eye Contact:  Good  Speech:  Clear and Coherent and Normal Rate  Volume:  Normal  Mood:  Euthymic and improved since onset  Affect:  Appropriate and Congruent  Thought Process:  Coherent and Descriptions of Associations: Intact  Orientation:  Full (Time, Place, and Person)  Thought Content:  Logical  Suicidal Thoughts:  No  Homicidal Thoughts:  No  Memory:  Immediate;   Good Recent;   Good Remote;   Good  Judgement:  Good  Insight:  Good  Psychomotor Activity:  Normal  Concentration:  Concentration: Good and Attention Span: Good  Recall:  Good  Fund of Knowledge:   Good  Language:  Good  Akathisia:  NA  Handed:  Right  AIMS (if indicated):     Assets:  Communication Skills Desire for Improvement Financial Resources/Insurance Housing Social Support  ADL's:  Intact  Cognition:  WNL  Sleep:   >6 hours     Demographic Factors:  NA  Loss Factors: NA  Historical Factors: NA  Risk Reduction Factors:   Responsible for children under 54 years of age, Sense of responsibility to family, Living with another person, especially a relative, Positive social support and Positive therapeutic relationship  Continued Clinical Symptoms:  Anxiety and sleeping concerns for which she will follow-up with outpatient [facility] resources. symptoms improved during admission.   Cognitive Features That Contribute To Risk:  None    Suicide Risk:  Minimal: No identifiable suicidal ideation.  Patients presenting with no risk factors but with morbid ruminations; may be classified as minimal risk based on the severity of the depressive symptoms  Follow-up Information    Schedule an appointment as soon as possible for a visit  with [place] information: [place] [address] [phone]           Plan Of Care/Follow-up recommendations:  Recommendations: As per above assessment, there are no current grounds for involuntary commitment at this time.  The patient is not currently interested in inpatient services but expresses her desire for outpatient mental health care.  Although she demonstrates symptomatic improvement with zyprexa 2.5 mg, tthis is not listed on her insurance formulary this may not be a cost effective option for her.  Per records review, she was prescribed seroquel 25mg  with her previous admission a few week prior with no adverse concerns; will discharge her home with 7 days supply to continue. She will follow-up with previously provided psychiatric resources for continued medication management and  counseling within 7 days of discharge.  Above was discussed with patient concordance.   Disposition: No evidence of imminent risk to self or others at present.   Patient does not meet criteria for psychiatric inpatient admission. Supportive therapy provided about ongoing stressors. Discussed crisis plan, support from social network, calling 911, coming to the Emergency Department, and calling Suicide Hotline.   Spoke with Dr. Ranelle Oyster, [place]; informed of above recommendation and disposition  Disposition:  Chales Abrahams, NP 02/27/2020, 12:46 PM

## 2020-02-27 NOTE — ED Notes (Signed)
Patient given her clothes will come out or discharge instructions when done dressing up.

## 2020-02-27 NOTE — ED Notes (Signed)
Patient sleeping comfortably. Will re-assess upon awakening. Patient awaiting psych eval and plan of care.

## 2020-02-27 NOTE — ED Notes (Signed)
Plan of care is to d/c patient ome with follow up resources and rx.

## 2020-02-27 NOTE — ED Notes (Signed)
NP and TTS at bedside to consult. awaiting further plan of care.

## 2020-03-01 ENCOUNTER — Telehealth: Payer: Self-pay

## 2020-03-01 NOTE — Telephone Encounter (Addendum)
Patient called & I was unable to take call. I have tried to call back twice now & patient did not answer. No VM set up to LM.   Patient called back & wanted to be seen by psychiatrist this week. I let patient know that Marijo Conception was working on this for her. She said that hospital gave her one weeks worth of Seroquel. It was helping her to sleep, but not helping her day time racing thoughts. I asked patient to also be sure that she kept 10am appointment tomorrow. Pt confirmed that she would.  Any progress Rasheedah. Patient is really reaching out.

## 2020-03-02 ENCOUNTER — Ambulatory Visit: Payer: BLUE CROSS/BLUE SHIELD | Admitting: Family

## 2020-03-02 ENCOUNTER — Telehealth: Payer: Self-pay | Admitting: Family

## 2020-03-02 NOTE — Telephone Encounter (Signed)
FYI I spoke with Marijo Conception about this & they are supposed to be sending over info about mandrin interpreter.

## 2020-03-02 NOTE — Telephone Encounter (Signed)
I called [facility] on 03/01/2020 regarding referral pt was called and it was translator on the line pt refused stating she wanted a Engineer, maintenance (IT).

## 2020-03-04 NOTE — Telephone Encounter (Signed)
R,  Circling back here.  Have you been able to make headway on this?

## 2020-03-04 NOTE — Telephone Encounter (Signed)
Patient refused referral because psychiatrist was not mandarin. She was told that she would have interpreter present. Dr. Elvera Maria office did reach out to patient.

## 2020-03-07 ENCOUNTER — Ambulatory Visit: Payer: BLUE CROSS/BLUE SHIELD | Admitting: Family

## 2020-03-09 ENCOUNTER — Emergency Department
Admission: EM | Admit: 2020-03-09 | Discharge: 2020-03-10 | Disposition: A | Discharge status: Other Acute Inpatient Hospital | Payer: BLUE CROSS/BLUE SHIELD | Source: Home / Self Care | Attending: Emergency Medicine | Admitting: Emergency Medicine

## 2020-03-09 ENCOUNTER — Other Ambulatory Visit: Payer: Self-pay

## 2020-03-09 ENCOUNTER — Encounter: Payer: Self-pay | Admitting: Emergency Medicine

## 2020-03-09 ENCOUNTER — Ambulatory Visit (INDEPENDENT_AMBULATORY_CARE_PROVIDER_SITE_OTHER): Payer: BLUE CROSS/BLUE SHIELD | Admitting: Family

## 2020-03-09 DIAGNOSIS — Z72820 Sleep deprivation: Secondary | ICD-10-CM | POA: Insufficient documentation

## 2020-03-09 DIAGNOSIS — Z0000 Encounter for general adult medical examination without abnormal findings: Secondary | ICD-10-CM

## 2020-03-09 DIAGNOSIS — F419 Anxiety disorder, unspecified: Secondary | ICD-10-CM | POA: Diagnosis not present

## 2020-03-09 DIAGNOSIS — Z20822 Contact with and (suspected) exposure to covid-19: Secondary | ICD-10-CM | POA: Insufficient documentation

## 2020-03-09 DIAGNOSIS — F23 Brief psychotic disorder: Secondary | ICD-10-CM | POA: Insufficient documentation

## 2020-03-09 DIAGNOSIS — F32A Depression, unspecified: Secondary | ICD-10-CM | POA: Diagnosis present

## 2020-03-09 DIAGNOSIS — Z046 Encounter for general psychiatric examination, requested by authority: Secondary | ICD-10-CM | POA: Insufficient documentation

## 2020-03-09 DIAGNOSIS — G9341 Metabolic encephalopathy: Secondary | ICD-10-CM | POA: Diagnosis present

## 2020-03-09 DIAGNOSIS — J45909 Unspecified asthma, uncomplicated: Secondary | ICD-10-CM | POA: Insufficient documentation

## 2020-03-09 DIAGNOSIS — F320 Major depressive disorder, single episode, mild: Secondary | ICD-10-CM | POA: Diagnosis present

## 2020-03-09 DIAGNOSIS — R45851 Suicidal ideations: Secondary | ICD-10-CM | POA: Insufficient documentation

## 2020-03-09 DIAGNOSIS — R638 Other symptoms and signs concerning food and fluid intake: Secondary | ICD-10-CM | POA: Insufficient documentation

## 2020-03-09 DIAGNOSIS — R410 Disorientation, unspecified: Secondary | ICD-10-CM | POA: Diagnosis present

## 2020-03-09 DIAGNOSIS — F329 Major depressive disorder, single episode, unspecified: Secondary | ICD-10-CM

## 2020-03-09 DIAGNOSIS — R451 Restlessness and agitation: Secondary | ICD-10-CM | POA: Insufficient documentation

## 2020-03-09 DIAGNOSIS — R519 Headache, unspecified: Secondary | ICD-10-CM | POA: Insufficient documentation

## 2020-03-09 LAB — URINE DRUG SCREEN, QUALITATIVE (ARMC ONLY)
Amphetamines, Ur Screen: NOT DETECTED
Barbiturates, Ur Screen: NOT DETECTED
Benzodiazepine, Ur Scrn: NOT DETECTED
Cannabinoid 50 Ng, Ur ~~LOC~~: NOT DETECTED
Cocaine Metabolite,Ur ~~LOC~~: NOT DETECTED
MDMA (Ecstasy)Ur Screen: NOT DETECTED
Methadone Scn, Ur: NOT DETECTED
Opiate, Ur Screen: NOT DETECTED
Phencyclidine (PCP) Ur S: NOT DETECTED
Tricyclic, Ur Screen: NOT DETECTED

## 2020-03-09 LAB — COMPREHENSIVE METABOLIC PANEL WITH GFR
ALT: 29 U/L (ref 0–44)
AST: 23 U/L (ref 15–41)
Albumin: 4.1 g/dL (ref 3.5–5.0)
Alkaline Phosphatase: 47 U/L (ref 38–126)
Anion gap: 10 (ref 5–15)
BUN: 11 mg/dL (ref 6–20)
CO2: 28 mmol/L (ref 22–32)
Calcium: 9.5 mg/dL (ref 8.9–10.3)
Chloride: 102 mmol/L (ref 98–111)
Creatinine, Ser: 0.72 mg/dL (ref 0.44–1.00)
GFR calc Af Amer: >60 mL/min
GFR calc non Af Amer: >60 mL/min
Glucose, Bld: 167 mg/dL — ABNORMAL HIGH (ref 70–99)
Potassium: 3.8 mmol/L (ref 3.5–5.1)
Sodium: 140 mmol/L (ref 135–145)
Total Bilirubin: 0.8 mg/dL (ref 0.3–1.2)
Total Protein: 7.3 g/dL (ref 6.5–8.1)

## 2020-03-09 LAB — CBC
HCT: 37.4 % (ref 36.0–46.0)
Hemoglobin: 12.2 g/dL (ref 12.0–15.0)
MCH: 22.1 pg — ABNORMAL LOW (ref 26.0–34.0)
MCHC: 32.6 g/dL (ref 30.0–36.0)
MCV: 67.9 fL — ABNORMAL LOW (ref 80.0–100.0)
Platelets: 246 10*3/uL (ref 150–400)
RBC: 5.51 MIL/uL — ABNORMAL HIGH (ref 3.87–5.11)
RDW: 16.1 % — ABNORMAL HIGH (ref 11.5–15.5)
WBC: 7.9 10*3/uL (ref 4.0–10.5)
nRBC: 0 % (ref 0.0–0.2)

## 2020-03-09 LAB — PREGNANCY, URINE: Preg Test, Ur: NEGATIVE

## 2020-03-09 LAB — ETHANOL: Alcohol, Ethyl (B): <10 mg/dL

## 2020-03-09 LAB — SALICYLATE LEVEL: Salicylate Lvl: <7 mg/dL — ABNORMAL LOW (ref 7.0–30.0)

## 2020-03-09 LAB — SARS CORONAVIRUS 2 BY RT PCR (HOSPITAL ORDER, PERFORMED IN ~~LOC~~ HOSPITAL LAB): SARS Coronavirus 2: NEGATIVE

## 2020-03-09 LAB — ACETAMINOPHEN LEVEL: Acetaminophen (Tylenol), Serum: <10 ug/mL — ABNORMAL LOW (ref 10–30)

## 2020-03-09 MED ORDER — DIAZEPAM 5 MG PO TABS
10.0000 mg | ORAL_TABLET | Freq: Once | ORAL | Status: AC
  Administered 2020-03-09: 10 mg via ORAL
  Filled 2020-03-09 (×2): qty 2

## 2020-03-09 NOTE — ED Notes (Signed)
Dressed out by this RN and brittany NT. BPD remained in room facing other direction.   1 pair shoes 1 pair socks 1 pair black pants 1 pair underwear 1 polo shirt 1 black hand brace 1 bra 1 hair tie

## 2020-03-09 NOTE — ED Notes (Signed)
Patient refused to take the Valium ordered by her doctor, patient said she was fine and calm and did not need it. MD informed Patient is sitting on bed, calmly NAD noted

## 2020-03-09 NOTE — ED Notes (Signed)
Staff from [facility] called regarding patient.  Staff member states they are very concerned regarding the patient.  Patient has not been sleeping or eating well, having racing thoughts and stating, "let me die."  Patient asked a provider today if there was a shot they could give her to kill her.  Staff feels patient's medications of hydroxyzine and trazodone are not working.

## 2020-03-09 NOTE — Patient Instructions (Addendum)
I would like for you to go to [facility] services RIGHT NOW  They have counseling and psychiatrists there who can see you right now  The address is [address] [place], [place] [address].  Phone number is [phone].   They are near [facility] and [facility].   I will be following closely.

## 2020-03-09 NOTE — ED Triage Notes (Signed)
This note is not being shared with the patient for the following reason: To prevent harm (release of this note would result in harm to the life or physical safety of the patient or another). Pt sent from [facility] under IVC with BPD.  Pt denies SI at this time and states "I don't know" when asked why [facility] sent her here. Pt not wanting to answer most questions and just look at this RN.

## 2020-03-09 NOTE — Telephone Encounter (Signed)
Discussed with pt during visit today

## 2020-03-09 NOTE — ED Notes (Signed)
Pts VS obtained pt very upset and crying. Selena Batten, RN notified of pts BP.

## 2020-03-09 NOTE — ED Provider Notes (Signed)
ER Provider Note       Time seen: 5:37 PM    I have reviewed the vital signs and the nursing notes.  HISTORY   Chief Complaint Psychiatric Evaluation    HPI Marissa Walsh is a 39 y.o. female with a history of asthma, bacterial vaginosis, ovarian cyst, thalassemia who presents today for possible psychosis.  Patient arrives from [facility] under IVC.  Patient states she is losing her mind, this worsened since the Covid vaccination.  She states she does not want to be a wife or mother anymore.  She is having suicidal thoughts.  Past Medical History:  Diagnosis Date  . Asthma   . Bacterial vaginosis   . Ovarian cyst   . Thalassemia   . Vulvar dermatitis     History reviewed. No pertinent surgical history.  Allergies Shellfish allergy and Alcohol-sulfur [sulfur]  Review of Systems Constitutional: Negative for fever. Cardiovascular: Negative for chest pain. Respiratory: Negative for shortness of breath. Gastrointestinal: Negative for abdominal pain, vomiting and diarrhea. Musculoskeletal: Negative for back pain. Skin: Negative for rash. Neurological: Negative for headaches, focal weakness or numbness. Psychiatric: Positive for suicidal ideation, disorganized thought  All systems negative/normal/unremarkable except as stated in the HPI  ____________________________________________   PHYSICAL EXAM:  VITAL SIGNS: Vitals:   03/09/20 1711  BP: (!) 142/94  Pulse: 89  Resp: 16  Temp: 99 F (37.2 C)  SpO2: 99%    Constitutional: Agitated, mild distress Eyes: Conjunctivae are normal. Normal extraocular movements. ENT      Head: Normocephalic and atraumatic.      Nose: No congestion/rhinnorhea.      Mouth/Throat: Mucous membranes are moist.      Neck: No stridor. Cardiovascular: Normal rate, regular rhythm. No murmurs, rubs, or gallops. Respiratory: Normal respiratory effort without tachypnea nor retractions. Breath sounds are clear and equal bilaterally. No  wheezes/rales/rhonchi. Gastrointestinal: Soft and nontender. Normal bowel sounds Musculoskeletal: Nontender with normal range of motion in extremities. No lower extremity tenderness nor edema. Neurologic:  Normal speech and language. No gross focal neurologic deficits are appreciated.  Skin:  Skin is warm, dry and intact. No rash noted. Psychiatric: Pressured speech, disorganized thought ____________________________________________   LABS (pertinent positives/negatives)  Labs Reviewed  COMPREHENSIVE METABOLIC PANEL - Abnormal; Notable for the following components:      Result Value   Glucose, Bld 167 (*)    All other components within normal limits  SALICYLATE LEVEL - Abnormal; Notable for the following components:   Salicylate Lvl <1.6 (*)    All other components within normal limits  ACETAMINOPHEN LEVEL - Abnormal; Notable for the following components:   Acetaminophen (Tylenol), Serum <10 (*)    All other components within normal limits  CBC - Abnormal; Notable for the following components:   RBC 5.51 (*)    MCV 67.9 (*)    MCH 22.1 (*)    RDW 16.1 (*)    All other components within normal limits  SARS CORONAVIRUS 2 BY RT PCR (HOSPITAL ORDER, [facility])  ETHANOL  URINE DRUG SCREEN, QUALITATIVE ([facility] ONLY)  PREGNANCY, URINE  POC URINE PREG, ED  POC URINE PREG, ED   DIFFERENTIAL DIAGNOSIS  Involuntary commitment, acute psychosis, bipolar disorder, schizophrenia  ASSESSMENT AND PLAN  Involuntary commitment, psychosis   Plan: The patient had presented for acute psychosis under involuntary commitment. Patient's labs have not revealed any acute process.  Patient was very agitated earlier and received oral Valium which did calm her somewhat.  She is cleared for psychiatric evaluation and disposition.  Daryel November MD    Note: This note was generated in part or whole with voice recognition software. Voice recognition is usually quite accurate  but there are transcription errors that can and very often do occur. I apologize for any typographical errors that were not detected and corrected.     Emily Filbert, MD 03/09/20 2135

## 2020-03-09 NOTE — Progress Notes (Signed)
Subjective:    Patient ID: Marissa Walsh, female    DOB: [birth year], 39 y.o.   MRN: [id number]  CC: Kimya Mccahill is a 39 y.o. female who presents today for follow up.   HPI: Accompanied by husband, interpretor  Tearful. Crying.  States she is not sleeping. She knows she has a husband and children who love her. They all think 'Im crazy.'  'I dont want to live. I dont want to hurt anyone.Im a scardy cat'.  Not taking medications.  Went to [employer] and saw a Social worker and arranged appointment to see psychiatrist for next month.              HISTORY:  Past Medical History:  Diagnosis Date   Asthma    Bacterial vaginosis    Ovarian cyst    Thalassemia    Vulvar dermatitis    No past surgical history on file. Family History  Problem Relation Age of Onset   Hyperlipidemia Mother    Hypertension Mother    Diabetes Mother    Cancer Father 32       colon cancer    Allergies: Shellfish allergy and Alcohol-sulfur [sulfur] No current facility-administered medications on file prior to visit.   Current Outpatient Medications on File Prior to Visit  Medication Sig Dispense Refill   ferrous sulfate 325 (65 FE) MG EC tablet Take 1 tablet (325 mg total) by mouth 2 (two) times daily with a meal. 60 tablet 2   hydrOXYzine (ATARAX/VISTARIL) 10 MG tablet TAKE 1 TABLET (10 MG TOTAL) BY MOUTH 2 (TWO) TIMES DAILY AS NEEDED FOR ANXIETY. 180 tablet 1   QUEtiapine (SEROQUEL) 25 MG tablet Take 1 tablet (25 mg total) by mouth at bedtime. 7 tablet 0   traZODone (DESYREL) 50 MG tablet Take 2 tablets (100 mg total) by mouth at bedtime. 60 tablet 2    Social History   Tobacco Use   Smoking status: Never Smoker   Smokeless tobacco: Never Used  Substance Use Topics   Alcohol use: No   Drug use: No    Review of Systems  Constitutional: Negative for chills and fever.  Respiratory: Negative for cough.   Cardiovascular: Negative for chest pain and palpitations.  Gastrointestinal:  Negative for nausea and vomiting.  Psychiatric/Behavioral: Positive for sleep disturbance. The patient is nervous/anxious.       Objective:    BP 126/88 (BP Location: Left Arm, Patient Position: Sitting)    Pulse 91    Temp 98.1 F (36.7 C)    Ht 5' 2.24" (1.581 m)    Wt 127 lb (57.6 kg)    LMP 03/03/2020 (Approximate)    SpO2 98%    BMI 23.05 kg/m  BP Readings from Last 3 Encounters:  03/09/20 (!) 143/105  03/09/20 126/88  02/27/20 128/68   Wt Readings from Last 3 Encounters:  03/09/20 127 lb (57.6 kg)  03/09/20 127 lb (57.6 kg)  02/23/20 126 lb 3.2 oz (57.2 kg)    Physical Exam Vitals reviewed.  Constitutional:      Appearance: She is well-developed.  Eyes:     Conjunctiva/sclera: Conjunctivae normal.  Cardiovascular:     Rate and Rhythm: Normal rate and regular rhythm.     Pulses: Normal pulses.     Heart sounds: Normal heart sounds.  Pulmonary:     Effort: Pulmonary effort is normal.     Breath sounds: Normal breath sounds. No wheezing, rhonchi or rales.  Skin:    General: Skin is warm  and dry.  Neurological:     Mental Status: She is alert.  Psychiatric:        Mood and Affect: Affect is flat and tearful.        Speech: Speech normal.        Behavior: Behavior is cooperative.        Thought Content: Thought content normal.     Comments: Patient crying excessively at first.  Soon thereafter she stopped, and was very quiet, flat.        Assessment & Plan:   Problem List Items Addressed This Visit      Other   Anxiety and depression    Patient does not endorse a suicide plan or intent to hurt herself.  She states that she is too much of a 'scardy cat' to hurt self.  However she repeatedly said she does not want to live anymore, appears in much distress, noncompliant with medications,  I am concerned this could be an episode of psychosis particularly in the setting of inadequate sleep.  We have called [facility] crisis line who advised bring patient immediately to [facility]  for further evaluation in regards to safety, possible IVC.  When I called back to speak with crisis counselor, she advised of  IVC and patient would be taken [facility] emergency room.  Will follow.          I am having [name] maintain her ferrous sulfate, hydrOXYzine, traZODone, and QUEtiapine.   No orders of the defined types were placed in this encounter.   Return precautions given.   Risks, benefits, and alternatives of the medications and treatment plan prescribed today were discussed, and patient expressed understanding.   Education regarding symptom management and diagnosis given to patient on AVS.  Continue to follow with Allegra Grana, FNP for routine health maintenance.   [name] and I agreed with plan.   Rennie Plowman, FNP  I have spent 35 minutes with a patient including  Exam,  reviewing medical records, coordination of care with counselor and discussion plan of care.

## 2020-03-09 NOTE — BH Assessment (Signed)
Assessment Note  Marissa Walsh is an 39 y.o. female presenting to [facility] ED under IVC given by [facility]. Per triage Pt sent from [facility] under IVC with BPD.  Pt denies SI at this time and states "I don't know" when asked why [facility] sent her here. Pt not wanting to answer most questions and just look at this RN. Staff from [facility] called regarding patient.  Staff member states they are very concerned regarding the patient.  Patient has not been sleeping or eating well, having racing thoughts and stating, "let me die."  Patient asked a provider today if there was a shot they could give her to kill her.  Staff feels patient's medications of hydroxyzine and trazodone are not working. During assessment patient was very tearful and reports that her children do not listen to her "they say I'm stupid." Patient reports SI "because my kids don't need me anymore, I'm not a good mom." Patient denies a current plan. Patient denies HI/AH/VH and does not appear to be responding to any internal or external stimuli. Per [facility] eval patient reported things such as "I need medicine" "all bad things." She reported thoughts of going to the crazy hospital, don't kill self or don't kill others. Reports being afraid of everything and was observed by [facility] clinician of patient talking to herself and telling herself to stop thinking.   Per Psyc NP patient is recommended for Inpatient Hospitalization  Diagnosis: Major Depressive Disorder, Acute Psychosis  Past Medical History:  Past Medical History:  Diagnosis Date  . Asthma   . Bacterial vaginosis   . Ovarian cyst   . Thalassemia   . Vulvar dermatitis     History reviewed. No pertinent surgical history.  Family History:  Family History  Problem Relation Age of Onset  . Hyperlipidemia Mother   . Hypertension Mother   . Diabetes Mother   . Cancer Father 67       colon cancer    Social History:  reports that she has never smoked. She has never used smokeless tobacco. She reports that she does  not drink alcohol or use drugs.  Additional Social History:  Alcohol / Drug Use Pain Medications: See MAR Prescriptions: See MAR Over the Counter: See MAR History of alcohol / drug use?: No history of alcohol / drug abuse  CIWA: CIWA-Ar BP: (!) 143/105 Pulse Rate: 94 COWS:    Allergies:  Allergies  Allergen Reactions  . Shellfish Allergy Itching  . Alcohol-Sulfur [Sulfur] Itching    Home Medications: (Not in a hospital admission)   OB/GYN Status:  Patient's last menstrual period was 03/03/2020 (approximate).  General Assessment Data Location of Assessment: [facility] ED TTS Assessment: In system Is this a Tele or Face-to-Face Assessment?: Face-to-Face Is this an Initial Assessment or a Re-assessment for this encounter?: Initial Assessment Patient Accompanied by:: N/A Language Other than English: Yes What is your preferred language: [place] Air traffic controller) Living Arrangements: Other (Comment)(Private Residence) What gender do you identify as?: Female Marital status: Married Pregnancy Status: No Living Arrangements: Spouse/significant other, Children Can pt return to current living arrangement?: Yes Admission Status: Involuntary Petitioner: Other Is patient capable of signing voluntary admission?: No Referral Source: Other([facility]) Insurance type: Scientist, research (physical sciences) Exam [facility] Walk-in ONLY) Medical Exam completed: Yes  Crisis Care Plan Living Arrangements: Spouse/significant other, Children Legal Guardian: Other:(Self) Name of Psychiatrist: None Name of Therapist: None  Education Status Is patient currently in school?: No Is the patient employed, unemployed or receiving disability?: Employed  Risk  to self with the past 6 months Suicidal Ideation: Yes-Currently Present Has patient been a risk to self within the past 6 months prior to admission? : Yes Suicidal Intent: Yes-Currently Present Has patient had any suicidal intent within the past 6 months prior to admission?  : Yes Is patient at risk for suicide?: Yes Suicidal Plan?: No Has patient had any suicidal plan within the past 6 months prior to admission? : No Access to Means: No What has been your use of drugs/alcohol within the last 12 months?: None Previous Attempts/Gestures: No How many times?: 0 Other Self Harm Risks: None Triggers for Past Attempts: None known Intentional Self Injurious Behavior: None Family Suicide History: Unknown Recent stressful life event(s): Other (Comment)(Family issues) Persecutory voices/beliefs?: No Depression: Yes Depression Symptoms: Tearfulness, Isolating, Loss of interest in usual pleasures, Feeling worthless/self pity Substance abuse history and/or treatment for substance abuse?: No Suicide prevention information given to non-admitted patients: Not applicable  Risk to Others within the past 6 months Homicidal Ideation: No Does patient have any lifetime risk of violence toward others beyond the six months prior to admission? : No Thoughts of Harm to Others: No Current Homicidal Intent: No Current Homicidal Plan: No Access to Homicidal Means: No Identified Victim: None History of harm to others?: No Assessment of Violence: None Noted Violent Behavior Description: None Does patient have access to weapons?: No Criminal Charges Pending?: No Does patient have a court date: No Is patient on probation?: No  Psychosis Hallucinations: None noted Delusions: None noted  Mental Status Report Appearance/Hygiene: In scrubs Eye Contact: Good Motor Activity: Freedom of movement Speech: Logical/coherent, Soft Level of Consciousness: Crying, Alert Mood: Depressed, Sad, Anxious Affect: Depressed, Sad, Flat, Anxious Anxiety Level: Moderate Thought Processes: Coherent Judgement: Unimpaired Orientation: Person, Place, Time, Situation, Appropriate for developmental age Obsessive Compulsive Thoughts/Behaviors: None  Cognitive Functioning Concentration:  Normal Memory: Recent Intact, Remote Intact Is patient IDD: No Insight: Fair Impulse Control: Good Appetite: Fair Have you had any weight changes? : No Change Sleep: Decreased Total Hours of Sleep: 0 Vegetative Symptoms: None  ADLScreening [facility] Assessment Services) Patient's cognitive ability adequate to safely complete daily activities?: Yes Patient able to express need for assistance with ADLs?: Yes Independently performs ADLs?: Yes (appropriate for developmental age)  Prior Inpatient Therapy Prior Inpatient Therapy: No  Prior Outpatient Therapy Prior Outpatient Therapy: No Does patient have an ACCT team?: No Does patient have Intensive In-House Services?  : No Does patient have [facility] services? : No Does patient have P4CC services?: No  ADL Screening (condition at time of admission) Patient's cognitive ability adequate to safely complete daily activities?: Yes Is the patient deaf or have difficulty hearing?: No Does the patient have difficulty seeing, even when wearing glasses/contacts?: No Does the patient have difficulty concentrating, remembering, or making decisions?: No Patient able to express need for assistance with ADLs?: Yes Does the patient have difficulty dressing or bathing?: No Independently performs ADLs?: Yes (appropriate for developmental age) Does the patient have difficulty walking or climbing stairs?: No Weakness of Legs: None Weakness of Arms/Hands: None  Home Assistive Devices/Equipment Home Assistive Devices/Equipment: None  Therapy Consults (therapy consults require a physician order) PT Evaluation Needed: No OT Evalulation Needed: No SLP Evaluation Needed: No Abuse/Neglect Assessment (Assessment to be complete while patient is alone) Physical Abuse: Denies Verbal Abuse: Denies Sexual Abuse: Denies Exploitation of patient/patient's resources: Denies Self-Neglect: Denies Values / Beliefs Cultural Requests During Hospitalization:  None Spiritual Requests During Hospitalization: None Consults Spiritual Care Consult  Needed: No Transition of Care Team Consult Needed: No Advance Directives (For Healthcare) Does Patient Have a Medical Advance Directive?: No          Disposition: Per Psyc NP patient is recommended for Inpatient Hospitalization Disposition Initial Assessment Completed for this Encounter: Yes  On Site Evaluation by:   Reviewed with Physician:    Leonie Douglas MS LCASA 03/09/2020 9:25 PM

## 2020-03-09 NOTE — BH Assessment (Signed)
PATIENT BED AVAILABLE AFTER 9:30AM ON 03/10/20 PENDING NEGATIVE COVID RESULTS  Patient is to be admitted to [facility] by Psychiatric Nurse Practitioner Gillermo Murdoch.  Attending Physician will be Dr. Toni Amend.   Patient has been assigned to room 302, by [facility] Charge Nurse Cleo.   Intake Paper Work has been signed and placed on patient chart.  ER staff is aware of the admission:  [facility] ER Secretary    Dr. Mayford Knife, ER MD   Selena Batten Patient's Nurse   Nicole Cella Patient Access.

## 2020-03-09 NOTE — ED Notes (Signed)
IVC papers from [facility]

## 2020-03-10 ENCOUNTER — Inpatient Hospital Stay
Admit: 2020-03-10 | Discharge: 2020-03-14 | DRG: 885 | Disposition: A | Discharge status: Home / Self Care | Payer: BLUE CROSS/BLUE SHIELD | Source: Ambulatory Visit | Attending: Psychiatry | Admitting: Psychiatry

## 2020-03-10 DIAGNOSIS — Z20822 Contact with and (suspected) exposure to covid-19: Secondary | ICD-10-CM | POA: Diagnosis present

## 2020-03-10 DIAGNOSIS — F411 Generalized anxiety disorder: Secondary | ICD-10-CM | POA: Diagnosis present

## 2020-03-10 DIAGNOSIS — R739 Hyperglycemia, unspecified: Secondary | ICD-10-CM | POA: Diagnosis present

## 2020-03-10 DIAGNOSIS — G4700 Insomnia, unspecified: Secondary | ICD-10-CM | POA: Diagnosis present

## 2020-03-10 DIAGNOSIS — F323 Major depressive disorder, single episode, severe with psychotic features: Secondary | ICD-10-CM | POA: Diagnosis not present

## 2020-03-10 DIAGNOSIS — D569 Thalassemia, unspecified: Secondary | ICD-10-CM | POA: Diagnosis present

## 2020-03-10 DIAGNOSIS — D509 Iron deficiency anemia, unspecified: Secondary | ICD-10-CM | POA: Diagnosis present

## 2020-03-10 DIAGNOSIS — R45851 Suicidal ideations: Secondary | ICD-10-CM | POA: Diagnosis present

## 2020-03-10 DIAGNOSIS — F23 Brief psychotic disorder: Secondary | ICD-10-CM | POA: Diagnosis present

## 2020-03-10 DIAGNOSIS — D649 Anemia, unspecified: Secondary | ICD-10-CM | POA: Diagnosis present

## 2020-03-10 MED ORDER — QUETIAPINE FUMARATE 100 MG PO TABS: 100.0000 mg | ORAL_TABLET | Freq: Every day | ORAL | Status: DC

## 2020-03-10 MED ORDER — FERROUS SULFATE 325 (65 FE) MG PO TABS
325.0000 mg | ORAL_TABLET | Freq: Two times a day (BID) | ORAL | Status: DC
  Filled 2020-03-10 (×2): qty 1

## 2020-03-10 MED ORDER — ACETAMINOPHEN 325 MG PO TABS
650.0000 mg | ORAL_TABLET | Freq: Four times a day (QID) | ORAL | Status: DC | PRN
  Administered 2020-03-10: 650 mg via ORAL
  Filled 2020-03-10: qty 2

## 2020-03-10 MED ORDER — ACETAMINOPHEN 325 MG PO TABS: 650.0000 mg | ORAL_TABLET | Freq: Four times a day (QID) | ORAL | Status: DC | PRN

## 2020-03-10 MED ORDER — QUETIAPINE FUMARATE 25 MG PO TABS: 25.0000 mg | ORAL_TABLET | Freq: Every day | ORAL | Status: DC

## 2020-03-10 MED ORDER — TRAZODONE HCL 50 MG PO TABS
50.0000 mg | ORAL_TABLET | Freq: Every evening | ORAL | Status: DC | PRN
  Administered 2020-03-11: 50 mg via ORAL
  Filled 2020-03-10: qty 1

## 2020-03-10 MED ORDER — DIAZEPAM 5 MG PO TABS: 10.0000 mg | ORAL_TABLET | Freq: Once | ORAL | Status: DC

## 2020-03-10 MED ORDER — FERROUS SULFATE 325 (65 FE) MG PO TABS
325.0000 mg | ORAL_TABLET | Freq: Two times a day (BID) | ORAL | Status: DC
  Administered 2020-03-10 – 2020-03-14 (×8): 325 mg via ORAL
  Filled 2020-03-10 (×8): qty 1

## 2020-03-10 MED ORDER — CITALOPRAM HYDROBROMIDE 20 MG PO TABS
10.0000 mg | ORAL_TABLET | Freq: Every day | ORAL | Status: DC
  Administered 2020-03-10 – 2020-03-12 (×3): 10 mg via ORAL
  Filled 2020-03-10 (×3): qty 1

## 2020-03-10 MED ORDER — ALUM & MAG HYDROXIDE-SIMETH 200-200-20 MG/5ML PO SUSP: 30.0000 mL | ORAL | Status: DC | PRN

## 2020-03-10 MED ORDER — MAGNESIUM HYDROXIDE 400 MG/5ML PO SUSP: 30.0000 mL | Freq: Every day | ORAL | Status: DC | PRN

## 2020-03-10 MED ORDER — TRAZODONE HCL 50 MG PO TABS
50.0000 mg | ORAL_TABLET | Freq: Every evening | ORAL | Status: DC | PRN
  Administered 2020-03-10: 50 mg via ORAL
  Filled 2020-03-10: qty 1

## 2020-03-10 MED ORDER — QUETIAPINE FUMARATE 25 MG PO TABS
25.0000 mg | ORAL_TABLET | Freq: Four times a day (QID) | ORAL | Status: DC | PRN
  Administered 2020-03-10: 25 mg via ORAL
  Filled 2020-03-10 (×2): qty 1

## 2020-03-10 MED ORDER — HYDROXYZINE HCL 10 MG PO TABS
10.0000 mg | ORAL_TABLET | Freq: Two times a day (BID) | ORAL | Status: DC | PRN
  Filled 2020-03-10: qty 1

## 2020-03-10 MED ORDER — QUETIAPINE FUMARATE 100 MG PO TABS
100.0000 mg | ORAL_TABLET | Freq: Every day | ORAL | Status: DC
  Administered 2020-03-10: 100 mg via ORAL
  Filled 2020-03-10: qty 1

## 2020-03-10 MED ORDER — QUETIAPINE FUMARATE 25 MG PO TABS
25.0000 mg | ORAL_TABLET | Freq: Every day | ORAL | Status: DC
  Administered 2020-03-10: 25 mg via ORAL
  Filled 2020-03-10: qty 1

## 2020-03-10 NOTE — H&P (Signed)
Psychiatric Admission Assessment Adult  Patient Identification: Marissa Walsh MRN:  [id number] Date of Evaluation:  03/10/2020 Chief Complaint:  Acute psychosis (HCC) [F23] Principal Diagnosis: Severe major depression, single episode, with psychotic features (HCC) Diagnosis:  Principal Problem:   Severe major depression, single episode, with psychotic features (HCC) Active Problems:   Anemia   Acute psychosis (HCC)  History of Present Illness: Patient seen and chart reviewed.  The entire interview with this patient was conducted with the assistance of hospital provided Mandarin language interpreter.  This was done at the patient's request.  This is a 39 year old woman who came to the emergency room yesterday for what appears to be the third time in the last couple months complaining of a mixture of symptoms of anxiety, sleeplessness, racing thoughts.  She had been also complaining of this to her primary care provider who ultimately was concerned enough to refer the patient to [facility] who sent the patient back to the emergency room this time.  The history the patient tells me at this time is somewhat more benign than might be assumed from the hospital notes.  Patient tells me that for a couple weeks she has had trouble sleeping.  She will wake up early about 1:00 in the morning and not be able to go back to sleep.  She also has been having "racing thoughts" and felt like she had a lot on her mind.  When asked what she is worried about or what the racing thoughts concern she talks about her 3 sons and how they play video games too much.  Patient eventually talks about how she feels like she is "left behind" as her children grow up and she has little familiarity with using computers and only had a middle school education.  There are indications that she feels that her husband is not entirely attentive although no specific complaints about the relationship.  During the interview the patient becomes tearful but to the  best of my understanding did not become psychotic.  I could not detect any sign of delusional or psychotic thinking.  Patient told me that sometimes when she wakes up in the morning she will hear sounds around her but does not talk about daytime hallucinations.  She has been to see a couple of different providers over the last couple months including her primary care provider and coming to the emergency room.  She has been given a couple different trials of medication most recently trazodone.  She felt that that did not really do anything to help her.  At one point she was given olanzapine and from what I understand she thinks that may have been more helpful.  The description of the patient in the emergency room is more striking.  When she came to the ER in April she was so disorganized and bizarre and agitated that Dr. Burgess Estelle thought she had a metabolic encephalopathy although nothing was ever found.  On later visits including this 1 she is described as often saying that she wants to die and repeating that she is "a bad mother".  There is no indication however that she has done anything to try to harm her self.  I asked the patient about all of this and she admitted that it was true and also admitted that she had asked people if she could die.  She said she does not really want to die and does not want to kill her self but feels overwhelmed and helpless at times.  Patient does  not drink does not use any kind of illicit drugs and is not currently on any prescription medicine. Associated Signs/Symptoms: Depression Symptoms:  depressed mood, insomnia, difficulty concentrating, suicidal thoughts without plan, anxiety, disturbed sleep, (Hypo) Manic Symptoms:  None from what I can tell Anxiety Symptoms:  Excessive Worry, Excessive worry specifically around her own qualities as a mother and worry about her children although when I asked specific questions it sounds like her sons are doing fine and there was  really nothing very specific to worry about Psychotic Symptoms:  I could not really identify anything that I thought was psychotic PTSD Symptoms: Negative Total Time spent with patient: 1 hour  Past Psychiatric History: Prior to the last couple months she had never seen a mental health provider in the past.  No previous hospitalization.  It appears that her first contact with the provider was in April at our emergency room.  Her primary care doctor has been trying to refer her to an outpatient psychiatrist but from what I understand the patient had declined referral saying she only wanted someone who spoke Mandarin.  No history of suicide attempts or violence.  Is the patient at risk to self? No.  Has the patient been a risk to self in the past 6 months? No.  Has the patient been a risk to self within the distant past? No.  Is the patient a risk to others? No.  Has the patient been a risk to others in the past 6 months? No.  Has the patient been a risk to others within the distant past? No.   Prior Inpatient Therapy:   Prior Outpatient Therapy:    Alcohol Screening:   Substance Abuse History in the last 12 months:  No. Consequences of Substance Abuse: Negative Previous Psychotropic Medications: Yes  Psychological Evaluations: Yes  Past Medical History:  Past Medical History:  Diagnosis Date  . Asthma   . Bacterial vaginosis   . Ovarian cyst   . Thalassemia   . Vulvar dermatitis    No past surgical history on file. Family History:  Family History  Problem Relation Age of Onset  . Hyperlipidemia Mother   . Hypertension Mother   . Diabetes Mother   . Cancer Father 54       colon cancer   Family Psychiatric  History: Patient says she is not aware of any mental health problems in her family although she does not know very much of her extended family only her immediate family Tobacco Screening:   Social History:  Social History   Substance and Sexual Activity  Alcohol Use No      Social History   Substance and Sexual Activity  Drug Use No    Additional Social History:                           Allergies:   Allergies  Allergen Reactions  . Shellfish Allergy Itching  . Alcohol-Sulfur [Sulfur] Itching   Lab Results:  Results for orders placed or performed during the hospital encounter of 03/09/20 (from the past 48 hour(s))  Comprehensive metabolic panel     Status: Abnormal   Collection Time: 03/09/20  5:11 PM  Result Value Ref Range   Sodium 140 135 - 145 mmol/L   Potassium 3.8 3.5 - 5.1 mmol/L   Chloride 102 98 - 111 mmol/L   CO2 28 22 - 32 mmol/L   Glucose, Bld 167 (H) 70 - 99  mg/dL    Comment: Glucose reference range applies only to samples taken after fasting for at least 8 hours.   BUN 11 6 - 20 mg/dL   Creatinine, Ser 0.16 0.44 - 1.00 mg/dL   Calcium 9.5 8.9 - 01.0 mg/dL   Total Protein 7.3 6.5 - 8.1 g/dL   Albumin 4.1 3.5 - 5.0 g/dL   AST 23 15 - 41 U/L   ALT 29 0 - 44 U/L   Alkaline Phosphatase 47 38 - 126 U/L   Total Bilirubin 0.8 0.3 - 1.2 mg/dL   GFR calc non Af Amer >60 >60 mL/min   GFR calc Af Amer >60 >60 mL/min   Anion gap 10 5 - 15    Comment: Performed at [facility], [address] [place], [place] [address]  Ethanol     Status: None   Collection Time: 03/09/20  5:11 PM  Result Value Ref Range   Alcohol, Ethyl (B) <10 <10 mg/dL    Comment: (NOTE) Lowest detectable limit for serum alcohol is 10 mg/dL. For medical purposes only. Performed at [facility], [address] [place], [place] [address]   Salicylate level     Status: Abnormal   Collection Time: 03/09/20  5:11 PM  Result Value Ref Range   Salicylate Lvl <7.0 (L) 7.0 - 30.0 mg/dL    Comment: Performed at [facility], [address] [place], [place] [address]  Acetaminophen level     Status: Abnormal   Collection Time: 03/09/20  5:11 PM  Result Value Ref Range   Acetaminophen (Tylenol), Serum <10 (L) 10 - 30 ug/mL     Comment: (NOTE) Therapeutic concentrations vary significantly. A range of 10-30 ug/mL  may be an effective concentration for many patients. However, some  are best treated at concentrations outside of this range. Acetaminophen concentrations >150 ug/mL at 4 hours after ingestion  and >50 ug/mL at 12 hours after ingestion are often associated with  toxic reactions. Performed at [facility], [address] [place], [place] [address]   cbc     Status: Abnormal   Collection Time: 03/09/20  5:11 PM  Result Value Ref Range   WBC 7.9 4.0 - 10.5 K/uL   RBC 5.51 (H) 3.87 - 5.11 MIL/uL   Hemoglobin 12.2 12.0 - 15.0 g/dL   HCT 76.2 36 - 46 %   MCV 67.9 (L) 80.0 - 100.0 fL   MCH 22.1 (L) 26.0 - 34.0 pg   MCHC 32.6 30.0 - 36.0 g/dL   RDW 83.1 (H) 51.7 - 61.6 %   Platelets 246 150 - 400 K/uL   nRBC 0.0 0.0 - 0.2 %    Comment: Performed at [facility], [address], [place], [place] [address]  Urine Drug Screen, Qualitative     Status: None   Collection Time: 03/09/20  5:11 PM  Result Value Ref Range   Tricyclic, Ur Screen NONE DETECTED NONE DETECTED   Amphetamines, Ur Screen NONE DETECTED NONE DETECTED   MDMA (Ecstasy)Ur Screen NONE DETECTED NONE DETECTED   Cocaine Metabolite,Ur [place] NONE DETECTED NONE DETECTED   Opiate, Ur Screen NONE DETECTED NONE DETECTED   Phencyclidine (PCP) Ur S NONE DETECTED NONE DETECTED   Cannabinoid 50 Ng, Ur [place] NONE DETECTED NONE DETECTED   Barbiturates, Ur Screen NONE DETECTED NONE DETECTED   Benzodiazepine, Ur Scrn NONE DETECTED NONE DETECTED   Methadone Scn, Ur NONE DETECTED NONE DETECTED    Comment: (NOTE) Tricyclics + metabolites,  urine    Cutoff 1000 ng/mL Amphetamines + metabolites, urine  Cutoff 1000 ng/mL MDMA (Ecstasy), urine              Cutoff 500 ng/mL Cocaine Metabolite, urine          Cutoff 300 ng/mL Opiate + metabolites, urine        Cutoff 300 ng/mL Phencyclidine (PCP), urine         Cutoff 25 ng/mL Cannabinoid,  urine                 Cutoff 50 ng/mL Barbiturates + metabolites, urine  Cutoff 200 ng/mL Benzodiazepine, urine              Cutoff 200 ng/mL Methadone, urine                   Cutoff 300 ng/mL The urine drug screen provides only a preliminary, unconfirmed analytical test result and should not be used for non-medical purposes. Clinical consideration and professional judgment should be applied to any positive drug screen result due to possible interfering substances. A more specific alternate chemical method must be used in order to obtain a confirmed analytical result. Gas chromatography / mass spectrometry (GC/MS) is the preferred confirmat ory method. Performed at [facility], [address] [place], [place] [address]   Pregnancy, urine     Status: None   Collection Time: 03/09/20  5:11 PM  Result Value Ref Range   Preg Test, Ur NEGATIVE NEGATIVE    Comment: Performed at [facility], [address] [place], [place] [address]  SARS Coronavirus 2 by RT PCR (hospital order, performed in [facility] hospital lab) Nasopharyngeal Nasopharyngeal Swab     Status: None   Collection Time: 03/09/20  9:12 PM   Specimen: Nasopharyngeal Swab  Result Value Ref Range   SARS Coronavirus 2 NEGATIVE NEGATIVE    Comment: (NOTE) SARS-CoV-2 target nucleic acids are NOT DETECTED. The SARS-CoV-2 RNA is generally detectable in upper and lower respiratory specimens during the acute phase of infection. The lowest concentration of SARS-CoV-2 viral copies this assay can detect is 250 copies / mL. A negative result does not preclude SARS-CoV-2 infection and should not be used as the sole basis for treatment or other patient management decisions.  A negative result may occur with improper specimen collection / handling, submission of specimen other than nasopharyngeal swab, presence of viral mutation(s) within the areas targeted by this assay, and inadequate number of viral  copies (<250 copies / mL). A negative result must be combined with clinical observations, patient history, and epidemiological information. Fact Sheet for Patients:   BoilerBrush.com.cyhttps://www.fda.gov/media/136312/download Fact Sheet for Healthcare Providers: https://pope.com/https://www.fda.gov/media/136313/download This test is not yet approved or cleared  by the [place] FDA and has been authorized for detection and/or diagnosis of SARS-CoV-2 by FDA under an Emergency Use Authorization (EUA).  This EUA will remain in effect (meaning this test can be used) for the duration of the COVID-19 declaration under Section 564(b)(1) of the Act, 21 U.S.C. section 360bbb-3(b)(1), unless the authorization is terminated or revoked sooner. Performed at [facility], [address] [place], [place] [address]     Blood Alcohol level:  Lab Results  Component Value Date   [facility]10 03/09/2020   ETH <10 02/26/2020    Metabolic Disorder Labs:  Lab Results  Component Value Date   HGBA1C 5.1 07/11/2016   No results found for: PROLACTIN Lab Results  Component Value Date  CHOL 188 07/11/2016   TRIG 92.0 07/11/2016   HDL 43.70 07/11/2016   CHOLHDL 4 07/11/2016   VLDL 18.4 07/11/2016   LDLCALC 125 (H) 07/11/2016    Current Medications: Current Facility-Administered Medications  Medication Dose Route Frequency Provider Last Rate Last Admin  . acetaminophen (TYLENOL) tablet 650 mg  650 mg Oral Q6H PRN Gillermo Murdoch, NP      . alum & mag hydroxide-simeth (MAALOX/MYLANTA) 200-200-20 MG/5ML suspension 30 mL  30 mL Oral Q4H PRN Gillermo Murdoch, NP      . citalopram (CELEXA) tablet 10 mg  10 mg Oral Daily Krissa Utke T, MD      . ferrous sulfate tablet 325 mg  325 mg Oral BID WC Gillermo Murdoch, NP      . magnesium hydroxide (MILK OF MAGNESIA) suspension 30 mL  30 mL Oral Daily PRN Gillermo Murdoch, NP      . QUEtiapine (SEROQUEL) tablet 100 mg  100 mg Oral QHS Eri Mcevers T, MD      .  traZODone (DESYREL) tablet 50 mg  50 mg Oral QHS PRN Gillermo Murdoch, NP       PTA Medications: Medications Prior to Admission  Medication Sig Dispense Refill Last Dose  . ferrous sulfate 325 (65 FE) MG EC tablet Take 1 tablet (325 mg total) by mouth 2 (two) times daily with a meal. 60 tablet 2   . hydrOXYzine (ATARAX/VISTARIL) 10 MG tablet TAKE 1 TABLET (10 MG TOTAL) BY MOUTH 2 (TWO) TIMES DAILY AS NEEDED FOR ANXIETY. 180 tablet 1   . QUEtiapine (SEROQUEL) 25 MG tablet Take 1 tablet (25 mg total) by mouth at bedtime. 7 tablet 0   . traZODone (DESYREL) 50 MG tablet Take 2 tablets (100 mg total) by mouth at bedtime. 60 tablet 2     Musculoskeletal: Strength & Muscle Tone: within normal limits Gait & Station: normal Patient leans: N/A  Psychiatric Specialty Exam: Physical Exam  Nursing note and vitals reviewed. Constitutional: She appears well-developed.  HENT:  Head: Normocephalic and atraumatic.  Eyes: Pupils are equal, round, and reactive to light. Conjunctivae are normal.  Cardiovascular: Normal heart sounds.  Respiratory: Effort normal.  GI: Soft.  Musculoskeletal:        General: Normal range of motion.     Cervical back: Normal range of motion.  Neurological: She is alert.  Skin: Skin is warm and dry.  Psychiatric: Her speech is normal and behavior is normal. Thought content normal. Her mood appears anxious. She expresses impulsivity. She exhibits a depressed mood. She exhibits abnormal recent memory.    Review of Systems  Constitutional: Negative.   HENT: Negative.   Eyes: Negative.   Respiratory: Negative.   Cardiovascular: Negative.   Gastrointestinal: Negative.   Musculoskeletal: Negative.   Skin: Negative.   Neurological: Negative.   Psychiatric/Behavioral: Positive for dysphoric mood and sleep disturbance. The patient is nervous/anxious.     Last menstrual period 03/03/2020.There is no height or weight on file to calculate BMI.  General Appearance: Casual   Eye Contact:  Good  Speech:  Clear and Coherent  Volume:  Normal  Mood:  Anxious and Dysphoric  Affect:  Depressed and Tearful  Thought Process:  Coherent  Orientation:  Full (Time, Place, and Person)  Thought Content:  Rumination and Tangential  Suicidal Thoughts:  No  Homicidal Thoughts:  No  Memory:  Immediate;   Fair Recent;   Fair Remote;   Fair  Judgement:  Fair  Insight:  Fair  Psychomotor Activity:  Normal  Concentration:  Concentration: Poor  Recall:  [employer] of Knowledge:  Fair  Language:  Fair  Akathisia:  No  Handed:  Right  AIMS (if indicated):     Assets:  Communication Skills Desire for Improvement Financial Resources/Insurance Housing Physical Health Resilience Social Support  ADL's:  Intact  Cognition:  WNL  Sleep:       Treatment Plan Summary: Daily contact with patient to assess and evaluate symptoms and progress in treatment, Medication management and Plan This is a 39 year old woman with no past psychiatric history and no substance abuse who has been suffering for a couple months with multiple symptoms of anxiety and depression and poor sleep.  Severity of presentation has varied from day-to-day an hour to hour.  In conversation with me today the patient seems pretty lucid but also pretty anxious.  To me it sounds like the most likely explanation is that this is a major depression with a lot of anxiety.  I spent some time reassuring her that she had not done anything wrong and that none of this was her fault.  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  We talked about how she needs to take care of herself and get more emotional support.  Patient is asking for some kind of medication to help with this and I think that is appropriate.  I have suggested starting Seroquel at night and citalopram in the day as a combination for depression with possible intermittent psychotic symptoms.  We will  reassess her tomorrow as she is very anxious to be discharged but I would like to make sure at least we have something that might work..  Observation Level/Precautions:  15 minute checks  Laboratory:  Chemistry Profile  Psychotherapy:    Medications:    Consultations:    Discharge Concerns:    Estimated LOS:  Other:     Physician Treatment Plan for Primary Diagnosis: Severe major depression, single episode, with psychotic features (HCC) Long Term Goal(s): Improvement in symptoms so as ready for discharge  Short Term Goals: Ability to verbalize feelings will improve, Ability to disclose and discuss suicidal ideas and Ability to demonstrate self-control will improve  Physician Treatment Plan for Secondary Diagnosis: Principal Problem:   Severe major depression, single episode, with psychotic features (HCC) Active Problems:   Anemia   Acute psychosis (HCC)  Long Term Goal(s): Improvement in symptoms so as ready for discharge  Short Term Goals: Ability to maintain clinical measurements within normal limits will improve and Compliance with prescribed medications will improve  I certify that inpatient services furnished can reasonably be expected to improve the patient's condition.    Mordecai Rasmussen, MD 6/10/20212:35 PM

## 2020-03-10 NOTE — Tx Team (Signed)
Initial Treatment Plan 03/10/2020 4:13 PM [name] NID:[id number]    PATIENT STRESSORS: Marital or family conflict Medication change or noncompliance   PATIENT STRENGTHS: Average or above average intelligence Financial means Physical Health Supportive family/friends Work skills   PATIENT IDENTIFIED PROBLEMS: anxiety  Suicide ideations                   DISCHARGE CRITERIA:  Improved stabilization in mood, thinking, and/or behavior Medical problems require only outpatient monitoring Verbal commitment to aftercare and medication compliance  PRELIMINARY DISCHARGE PLAN: Outpatient therapy Placement in alternative living arrangements Return to previous work or school arrangements  PATIENT/FAMILY INVOLVEMENT: This treatment plan has been presented to and reviewed with the patient, Marissa Walsh.  The patient has been given the opportunity to ask questions and make suggestions.  Chalmers Cater, RN 03/10/2020, 4:13 PM

## 2020-03-10 NOTE — Progress Notes (Signed)
Patient was pleasant during assessment denying SI/HI/AVH, depression and anxiety with this Clinical research associate. Patient was observed interacting appropriately with staff and peers. Patient compliant with medication administration per MD orders. Patient given support, education and support to be active in her treatment plan. Patient being monitored Q 15 minutes for safety per unit protocol. Patient remains safe on the unit.

## 2020-03-10 NOTE — BHH Suicide Risk Assessment (Signed)
[facility] Admission Suicide Risk Assessment   Nursing information obtained from:    Demographic factors:    Current Mental Status:    Loss Factors:    Historical Factors:    Risk Reduction Factors:     Total Time spent with patient: 1 hour Principal Problem: Severe major depression, single episode, with psychotic features ([place]) Diagnosis:  Principal Problem:   Severe major depression, single episode, with psychotic features ([place]) Active Problems:   Anemia   Acute psychosis ([place])  Subjective Data: Patient seen and chart reviewed.  39 year old woman presented to the emergency room for the third time in the last couple months with mixture of anxiety depression confusion and sleep problems.  Patient made some statements documented about wanting to die and asking people if they would kill her.  There is no history of suicide attempts.  On interview today which was conducted with a Mandarin language interpreter the patient denies any suicidal thought at all  Continued Clinical Symptoms:    The "Alcohol Use Disorders Identification Test", Guidelines for Use in Primary Care, Second Edition.  World Pharmacologist [facility]). Score between 0-7:  no or low risk or alcohol related problems. Score between 8-15:  moderate risk of alcohol related problems. Score between 16-19:  high risk of alcohol related problems. Score 20 or above:  warrants further diagnostic evaluation for alcohol dependence and treatment.   CLINICAL FACTORS:   Depression:   Impulsivity Insomnia   Musculoskeletal: Strength & Muscle Tone: within normal limits Gait & Station: normal Patient leans: N/A  Psychiatric Specialty Exam: Physical Exam  Nursing note and vitals reviewed. Constitutional: She appears well-developed. She is cooperative.  HENT:  Head: Normocephalic and atraumatic.  Eyes: Pupils are equal, round, and reactive to light. Conjunctivae are normal.  Cardiovascular: Normal heart sounds.  Respiratory: Effort  normal.  GI: Soft.  Musculoskeletal:        General: Normal range of motion.     Cervical back: Normal range of motion.  Neurological: She is alert.  Skin: Skin is warm and dry.  Psychiatric: Her speech is normal and behavior is normal. Her mood appears anxious. Her affect is labile. Thought content is not paranoid. She expresses impulsivity. She expresses no homicidal and no suicidal ideation. She exhibits abnormal recent memory.    Review of Systems  Constitutional: Negative.   HENT: Negative.   Eyes: Negative.   Respiratory: Negative.   Cardiovascular: Negative.   Gastrointestinal: Negative.   Musculoskeletal: Negative.   Skin: Negative.   Neurological: Negative.   Psychiatric/Behavioral: Positive for dysphoric mood and sleep disturbance. The patient is nervous/anxious.     Last menstrual period 03/03/2020.There is no height or weight on file to calculate BMI.  General Appearance: Casual  Eye Contact:  Good  Speech:  Normal Rate  Volume:  Normal  Mood:  Angry and Depressed  Affect:  Congruent, Depressed and Tearful  Thought Process:  Coherent  Orientation:  Full (Time, Place, and Person)  Thought Content:  Logical and Rumination  Suicidal Thoughts:  No  Homicidal Thoughts:  No  Memory:  Immediate;   Fair Recent;   Fair Remote;   Fair  Judgement:  Fair  Insight:  Fair  Psychomotor Activity:  Decreased  Concentration:  Concentration: Poor  Recall:  [employer] of Knowledge:  Fair  Language:  Fair  Akathisia:  No  Handed:  Right  AIMS (if indicated):     Assets:  Desire for [place]  ADL's:  Intact  Cognition:  WNL  Sleep:         COGNITIVE FEATURES THAT CONTRIBUTE TO RISK:  Thought constriction (tunnel vision)    SUICIDE RISK:   Minimal: No identifiable suicidal ideation.  Patients presenting with no risk factors but with morbid ruminations; may be classified as minimal risk based on the severity of the  depressive symptoms  PLAN OF CARE: Continue 15-minute checks.  Engage if possible in individual and group therapy and assessment.  Initiate appropriate psychiatric medicine.  Reassess with particular focus on dangerousness prior to discharge  I certify that inpatient services furnished can reasonably be expected to improve the patient's condition.   Mordecai Rasmussen, MD 03/10/2020, 2:29 PM
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
## 2020-03-10 NOTE — ED Provider Notes (Signed)
Emergency Medicine Observation Re-evaluation Note  Marissa Walsh is a 39 y.o. female, seen on rounds today.  Pt initially presented to the ED for complaints of Psychiatric Evaluation Currently, the patient is resting, complains of headache.  Physical Exam  BP (!) 143/105 (BP Location: Left Arm)   Pulse 94   Temp 99 F (37.2 C) (Oral)   Resp 18   Ht 5\' 2"  (1.575 m)   Wt 57.6 kg   LMP 03/03/2020 (Approximate)   SpO2 97%   BMI 23.23 kg/m  Physical Exam   Constitutional: Resting comfortably. Eyes: Conjunctivae are normal. Head: Atraumatic. Nose: No congestion/rhinnorhea. Mouth/Throat: Mucous membranes are moist. Neck: Normal ROM Cardiovascular: No cyanosis noted. Respiratory: Normal respiratory effort. Gastrointestinal: Non-distended. Genitourinary: deferred Musculoskeletal: No lower extremity tenderness nor edema. Neurologic:  Normal speech and language. No gross focal neurologic deficits are appreciated. Skin:  Skin is warm, dry and intact. No rash noted.    ED Course / MDM  EKG:    I have reviewed the labs performed to date as well as medications administered while in observation.  Recent changes in the last 24 hours include patient moved to [facility], remains cooperative. Plan  Current plan is for psychiatric admission. Patient is under full IVC at this time.   [facility], MD 03/10/20 [phone]

## 2020-03-10 NOTE — Consult Note (Signed)
[facility] Face-to-Face Psychiatry Consult   Reason for Consult: Psychiatric Evaluation Referring Physician:  Dr. Mayford Knife Patient Identification: Marissa Walsh MRN:  [id number] Principal Diagnosis: <principal problem not specified> Diagnosis:  Active Problems:   Routine general medical examination at a health care facility   Mild single current episode of major depressive disorder (HCC)   Confusion   Acute metabolic encephalopathy   Anxiety and depression   Total Time spent with patient: 30 minutes  Subjective: " I am not a good mother."  (Very emotional) Marissa Walsh is a 39 y.o. female patient presented to [facility] ED via law enforcement by way of [facility] under involuntary commitment status (IVC). The patient was seen by [facility] initially and directed by her husband and law enforcement to bring her to the ED. The patient was seen at [facility] and presented with a flat affect, very emotional voicing, "I need the medicine." The patient was voicing that she wants to die.  It was reported that the patient's none compliant with her prescribed medication, which is trazodone and Vistaril.  The patient kept voicing, "I am a good person, don't do that to me, do not let me go to the crazy hospital." During her [facility] assessment, the patient's husband voice that she has not been sleeping, and most of her symptoms occurred post COVID-19 vaccinations.  The patient was seen face-to-face by this provider; the chart was reviewed and consulted with Dr. Mayford Knife on 03/09/2020 due to the patient's care. As a result, it was discussed with the EDP that the patient does meet the criteria to be admitted to the psychiatric inpatient unit.  The patient was seen sitting in her bed extremely emotional.  She voiced, "I am not a good mother, and I want to die."  "Can you help me die?"  "My head is full with a lot of things in it, and I cannot stop the things that are in my head." On evaluation, the patient is alert and oriented x 4, extremely emotional, very  tearful, anxious but cooperative, and mood-congruent with affect. The patient does appear to be responding to internal or external stimuli.  She is presenting with delusional thinking. The patient denies auditory or visual hallucinations. The patient admits to suicidal ideations, but denies homicidal ideations. The patient is presenting with some psychotic and paranoid behaviors. During an encounter with the patient, she was able to answer most questions appropriately.  Plan: The patient is a safety risk to self and requires psychiatric inpatient admission for stabilization and treatment.   HPI: Per Dr. Mayford Knife : Marissa Walsh is a 39 y.o. female with a history of asthma, bacterial vaginosis, ovarian cyst, thalassemia who presents today for possible psychosis.  Patient arrives from [facility] under IVC.  Patient states she is losing her mind, this worsened since the Covid vaccination.  She states she does not want to be a wife or mother anymore.  She is having suicidal thoughts.  Past Psychiatric History: No pertinent past psychiatric history   Risk to Self: Suicidal Ideation: Yes-Currently Present Suicidal Intent: Yes-Currently Present Is patient at risk for suicide?: Yes Suicidal Plan?: No Access to Means: No What has been your use of drugs/alcohol within the last 12 months?: None How many times?: 0 Other Self Harm Risks: None Triggers for Past Attempts: None known Intentional Self Injurious Behavior: None Risk to Others: Homicidal Ideation: No Thoughts of Harm to Others: No Current Homicidal Intent: No Current Homicidal Plan: No Access to Homicidal Means: No Identified Victim: None History  of harm to others?: No Assessment of Violence: None Noted Violent Behavior Description: None Does patient have access to weapons?: No Criminal Charges Pending?: No Does patient have a court date: No Prior Inpatient Therapy: Prior Inpatient Therapy: No Prior Outpatient Therapy: Prior Outpatient Therapy:  No Does patient have an ACCT team?: No Does patient have Intensive In-House Services?  : No Does patient have [facility] services? : No Does patient have P4CC services?: No  Past Medical History:  Past Medical History:  Diagnosis Date  . Asthma   . Bacterial vaginosis   . Ovarian cyst   . Thalassemia   . Vulvar dermatitis    History reviewed. No pertinent surgical history. Family History:  Family History  Problem Relation Age of Onset  . Hyperlipidemia Mother   . Hypertension Mother   . Diabetes Mother   . Cancer Father 58       colon cancer   Family Psychiatric  History:  Social History:  Social History   Substance and Sexual Activity  Alcohol Use No     Social History   Substance and Sexual Activity  Drug Use No    Social History   Socioeconomic History  . Marital status: Married    Spouse name: Not on file  . Number of children: Not on file  . Years of education: Not on file  . Highest education level: Not on file  Occupational History  . Not on file  Tobacco Use  . Smoking status: Never Smoker  . Smokeless tobacco: Never Used  Substance and Sexual Activity  . Alcohol use: No  . Drug use: No  . Sexual activity: Never  Other Topics Concern  . Not on file  Social History Narrative   Working [employer], [employer].    Mandarin [place];    Has been in [place] for 2 years.    Lives with husband.   3 boys;  18, 9 and 10. no complications for pregnancies.         Social Determinants of Health   Financial Resource Strain:   . Difficulty of Paying Living Expenses:   Food Insecurity:   . Worried About Programme researcher, broadcasting/film/video in the Last Year:   . Barista in the Last Year:   Transportation Needs:   . Freight forwarder (Medical):   Marland Kitchen Lack of Transportation (Non-Medical):   Physical Activity:   . Days of Exercise per Week:   . Minutes of Exercise per Session:   Stress:   . Feeling of Stress :   Social Connections:   . Frequency of  Communication with Friends and Family:   . Frequency of Social Gatherings with Friends and Family:   . Attends Religious Services:   . Active Member of Clubs or Organizations:   . Attends Banker Meetings:   Marland Kitchen Marital Status:    Additional Social History:    Allergies:   Allergies  Allergen Reactions  . Shellfish Allergy Itching  . Alcohol-Sulfur [Sulfur] Itching    Labs:  Results for orders placed or performed during the hospital encounter of 03/09/20 (from the past 48 hour(s))  Comprehensive metabolic panel     Status: Abnormal   Collection Time: 03/09/20  5:11 PM  Result Value Ref Range   Sodium 140 135 - 145 mmol/L   Potassium 3.8 3.5 - 5.1 mmol/L   Chloride 102 98 - 111 mmol/L   CO2 28 22 - 32 mmol/L   Glucose, Bld 167 (  H) 70 - 99 mg/dL    Comment: Glucose reference range applies only to samples taken after fasting for at least 8 hours.   BUN 11 6 - 20 mg/dL   Creatinine, Ser 1.610.72 0.44 - 1.00 mg/dL   Calcium 9.5 8.9 - 09.610.3 mg/dL   Total Protein 7.3 6.5 - 8.1 g/dL   Albumin 4.1 3.5 - 5.0 g/dL   AST 23 15 - 41 U/L   ALT 29 0 - 44 U/L   Alkaline Phosphatase 47 38 - 126 U/L   Total Bilirubin 0.8 0.3 - 1.2 mg/dL   GFR calc non Af Amer >60 >60 mL/min   GFR calc Af Amer >60 >60 mL/min   Anion gap 10 5 - 15    Comment: Performed at [facility], [address] [place], [place] [address]  Ethanol     Status: None   Collection Time: 03/09/20  5:11 PM  Result Value Ref Range   Alcohol, Ethyl (B) <10 <10 mg/dL    Comment: (NOTE) Lowest detectable limit for serum alcohol is 10 mg/dL. For medical purposes only. Performed at [facility], [address] [place], [place] [address]   Salicylate level     Status: Abnormal   Collection Time: 03/09/20  5:11 PM  Result Value Ref Range   Salicylate Lvl <7.0 (L) 7.0 - 30.0 mg/dL    Comment: Performed at [facility], [address] [place], [place] [address]  Acetaminophen level      Status: Abnormal   Collection Time: 03/09/20  5:11 PM  Result Value Ref Range   Acetaminophen (Tylenol), Serum <10 (L) 10 - 30 ug/mL    Comment: (NOTE) Therapeutic concentrations vary significantly. A range of 10-30 ug/mL  may be an effective concentration for many patients. However, some  are best treated at concentrations outside of this range. Acetaminophen concentrations >150 ug/mL at 4 hours after ingestion  and >50 ug/mL at 12 hours after ingestion are often associated with  toxic reactions. Performed at [facility], [address] [place], [place] [address]   cbc     Status: Abnormal   Collection Time: 03/09/20  5:11 PM  Result Value Ref Range   WBC 7.9 4.0 - 10.5 K/uL   RBC 5.51 (H) 3.87 - 5.11 MIL/uL   Hemoglobin 12.2 12.0 - 15.0 g/dL   HCT 30.837.4 36 - 46 %   MCV 67.9 (L) 80.0 - 100.0 fL   MCH 22.1 (L) 26.0 - 34.0 pg   MCHC 32.6 30.0 - 36.0 g/dL   RDW 65.716.1 (H) 84.611.5 - 96.215.5 %   Platelets 246 150 - 400 K/uL   nRBC 0.0 0.0 - 0.2 %    Comment: Performed at [facility], [address] [place], [place] [address]  Urine Drug Screen, Qualitative     Status: None   Collection Time: 03/09/20  5:11 PM  Result Value Ref Range   Tricyclic, Ur Screen NONE DETECTED NONE DETECTED   Amphetamines, Ur Screen NONE DETECTED NONE DETECTED   MDMA (Ecstasy)Ur Screen NONE DETECTED NONE DETECTED   Cocaine Metabolite,Ur [place] NONE DETECTED NONE DETECTED   Opiate, Ur Screen NONE DETECTED NONE DETECTED   Phencyclidine (PCP) Ur S NONE DETECTED NONE DETECTED   Cannabinoid 50 Ng, Ur  NONE DETECTED NONE DETECTED   Barbiturates, Ur Screen NONE DETECTED NONE DETECTED   Benzodiazepine, Ur Scrn NONE DETECTED NONE DETECTED   Methadone Scn, Ur NONE DETECTED NONE DETECTED    Comment: (  NOTE) Tricyclics + metabolites, urine    Cutoff 1000 ng/mL Amphetamines + metabolites, urine  Cutoff 1000 ng/mL MDMA (Ecstasy), urine              Cutoff 500 ng/mL Cocaine Metabolite, urine           Cutoff 300 ng/mL Opiate + metabolites, urine        Cutoff 300 ng/mL Phencyclidine (PCP), urine         Cutoff 25 ng/mL Cannabinoid, urine                 Cutoff 50 ng/mL Barbiturates + metabolites, urine  Cutoff 200 ng/mL Benzodiazepine, urine              Cutoff 200 ng/mL Methadone, urine                   Cutoff 300 ng/mL The urine drug screen provides only a preliminary, unconfirmed analytical test result and should not be used for non-medical purposes. Clinical consideration and professional judgment should be applied to any positive drug screen result due to possible interfering substances. A more specific alternate chemical method must be used in order to obtain a confirmed analytical result. Gas chromatography / mass spectrometry (GC/MS) is the preferred confirmat ory method. Performed at [facility], [address] [place], [place] [address]   Pregnancy, urine     Status: None   Collection Time: 03/09/20  5:11 PM  Result Value Ref Range   Preg Test, Ur NEGATIVE NEGATIVE    Comment: Performed at [facility], [address] [place], [place] [address]  SARS Coronavirus 2 by RT PCR (hospital order, performed in [facility] hospital lab) Nasopharyngeal Nasopharyngeal Swab     Status: None   Collection Time: 03/09/20  9:12 PM   Specimen: Nasopharyngeal Swab  Result Value Ref Range   SARS Coronavirus 2 NEGATIVE NEGATIVE    Comment: (NOTE) SARS-CoV-2 target nucleic acids are NOT DETECTED. The SARS-CoV-2 RNA is generally detectable in upper and lower respiratory specimens during the acute phase of infection. The lowest concentration of SARS-CoV-2 viral copies this assay can detect is 250 copies / mL. A negative result does not preclude SARS-CoV-2 infection and should not be used as the sole basis for treatment or other patient management decisions.  A negative result may occur with improper specimen collection / handling, submission of specimen other than  nasopharyngeal swab, presence of viral mutation(s) within the areas targeted by this assay, and inadequate number of viral copies (<250 copies / mL). A negative result must be combined with clinical observations, patient history, and epidemiological information. Fact Sheet for Patients:   StrictlyIdeas.no Fact Sheet for Healthcare Providers: BankingDealers.co.za This test is not yet approved or cleared  by the [place] FDA and has been authorized for detection and/or diagnosis of SARS-CoV-2 by FDA under an Emergency Use Authorization (EUA).  This EUA will remain in effect (meaning this test can be used) for the duration of the COVID-19 declaration under Section 564(b)(1) of the Act, 21 U.S.C. section 360bbb-3(b)(1), unless the authorization is terminated or revoked sooner. Performed at [facility], [address], [place], [place] [address]     Current Facility-Administered Medications  Medication Dose Route Frequency Provider Last Rate Last Admin  . acetaminophen (TYLENOL) tablet 650 mg  650 mg Oral Q6H PRN Caroline Sauger, NP   650 mg at 03/10/20 0038  . ferrous sulfate EC tablet 325 mg  325 mg Oral BID  WC Gillermo Murdoch, NP      . hydrOXYzine (ATARAX/VISTARIL) tablet 10 mg  10 mg Oral BID PRN Gillermo Murdoch, NP      . QUEtiapine (SEROQUEL) tablet 25 mg  25 mg Oral QHS Gillermo Murdoch, NP   25 mg at 03/10/20 0038  . traZODone (DESYREL) tablet 50 mg  50 mg Oral QHS PRN Gillermo Murdoch, NP   50 mg at 03/10/20 0038   Current Outpatient Medications  Medication Sig Dispense Refill  . ferrous sulfate 325 (65 FE) MG EC tablet Take 1 tablet (325 mg total) by mouth 2 (two) times daily with a meal. 60 tablet 2  . hydrOXYzine (ATARAX/VISTARIL) 10 MG tablet TAKE 1 TABLET (10 MG TOTAL) BY MOUTH 2 (TWO) TIMES DAILY AS NEEDED FOR ANXIETY. 180 tablet 1  . QUEtiapine (SEROQUEL) 25 MG tablet Take 1 tablet (25 mg  total) by mouth at bedtime. 7 tablet 0  . traZODone (DESYREL) 50 MG tablet Take 2 tablets (100 mg total) by mouth at bedtime. 60 tablet 2    Musculoskeletal: Strength & Muscle Tone: within normal limits Gait & Station: normal Patient leans: N/A  Psychiatric Specialty Exam: Physical Exam  Nursing note and vitals reviewed. Constitutional: She is oriented to person, place, and time. She appears well-developed and well-nourished.  Respiratory: Effort normal.  Musculoskeletal:        General: Normal range of motion.     Cervical back: Normal range of motion and neck supple.  Neurological: She is alert and oriented to person, place, and time.    Review of Systems  Psychiatric/Behavioral: Positive for agitation, confusion, sleep disturbance and suicidal ideas. The patient is nervous/anxious.   All other systems reviewed and are negative.   Blood pressure (!) 143/105, pulse 94, temperature 99 F (37.2 C), temperature source Oral, resp. rate 18, height 5\' 2"  (1.575 m), weight 57.6 kg, last menstrual period 03/03/2020, SpO2 97 %.Body mass index is 23.23 kg/m.  General Appearance: Negative and Bizarre  Eye Contact:  Good  Speech:  Clear and Coherent  Volume:  Decreased  Mood:  Anxious, Depressed, Hopeless and Worthless  Affect:  Blunt, Congruent, Depressed, Flat and Tearful  Thought Process:  Coherent  Orientation:  Full (Time, Place, and Person)  Thought Content:  Logical, Ideas of Reference:   Paranoia and Paranoid Ideation  Suicidal Thoughts:  Yes.  without intent/plan  Homicidal Thoughts:  No  Memory:  Immediate;   Good Recent;   Good Remote;   Good  Judgement:  Impaired  Insight:  Lacking  Psychomotor Activity:  Increased  Concentration:  Concentration: Good  Recall:  Good  Fund of Knowledge:  Good  Language:  Good  Akathisia:  Negative  Handed:  Right  AIMS (if indicated):     Assets:  Communication Skills Desire for Improvement Resilience Social Support  ADL's:   Intact  Cognition:  WNL  Sleep:    Insomnia     Treatment Plan Summary: Medication management and Plan Patient meets criteria for psychiatric inpatient admission.  Disposition: Recommend psychiatric Inpatient admission when medically cleared. Supportive therapy provided about ongoing stressors.  05/03/2020, NP 03/10/2020 2:19 AM

## 2020-03-10 NOTE — [facility] Group Notes (Signed)
LCSW Wellness Group Note   03/10/2020 12:30pm  Type of Group and Topic: Psychoeducational Group:  Wellness  Participation Level:  Did not attend  Description of Group  Wellness group introduces the topic and its focus on developing healthy habits across the spectrum and its relationship to a decrease in hospital admissions.  Six areas of wellness are discussed: physical, social spiritual, intellectual, occupational, and emotional.  Patients are asked to consider their current wellness habits and to identify areas of wellness where they are interested and able to focus on improvements.    Therapeutic Goals 1. Patients will understand components of wellness and how they can positively impact overall health.  2. Patients will identify areas of wellness where they have developed good habits. 3. Patients will identify areas of wellness where they would like to make improvements.    Summary of Patient Progress     Therapeutic Modalities: Cognitive Behavioral Therapy Psychoeducation    Romano Stigger Jon, LCSW 

## 2020-03-10 NOTE — ED Notes (Signed)
During assessment pt co headache pain. Janee Morn, NP informed and new orders received.

## 2020-03-10 NOTE — Assessment & Plan Note (Addendum)
Patient does not endorse a suicide plan or intent to hurt herself.  She states that she is too much of a 'scardy cat' to hurt self.  However she repeatedly said she does not want to live anymore, appears in much distress, noncompliant with medications,  I am concerned this could be an episode of psychosis particularly in the setting of inadequate sleep.  We have called [facility] crisis line gave report on patient, fax documents to their office, and was advised bring patient immediately to [facility] for further evaluation in regards to safety, possible IVC.  When I called back to speak with crisis counselor, she advised of  IVC and patient would be taken [facility] emergency room.  Will follow.

## 2020-03-10 NOTE — Plan of Care (Signed)
Patient new to the unit today, hasn't had time to progress  Problem: Education: Goal: Knowledge of [place] [facility] Education information/materials will improve Outcome: Not Progressing Goal: Emotional status will improve Outcome: Not Progressing Goal: Mental status will improve Outcome: Not Progressing Goal: Verbalization of understanding the information provided will improve Outcome: Not Progressing   Problem: Activity: Goal: Interest or engagement in activities will improve Outcome: Not Progressing Goal: Sleeping patterns will improve Outcome: Not Progressing   Problem: Coping: Goal: Ability to verbalize frustrations and anger appropriately will improve Outcome: Not Progressing Goal: Ability to demonstrate self-control will improve Outcome: Not Progressing   Problem: Health Behavior/Discharge Planning: Goal: Identification of resources available to assist in meeting health care needs will improve Outcome: Not Progressing Goal: Compliance with treatment plan for underlying cause of condition will improve Outcome: Not Progressing   Problem: Safety: Goal: Periods of time without injury will increase Outcome: Not Progressing   Problem: Education: Goal: Ability to state activities that reduce stress will improve Outcome: Not Progressing

## 2020-03-10 NOTE — ED Notes (Signed)
Pt brought into ED BHU via sally port and wand with metal detector for safety by [place] Security officer. Patient oriented to unit/care area: Pt informed of unit policies and procedures.  Informed that, for their safety, care areas are designed for safety and monitored by security cameras at all times. Patient verbalizes understanding, and verbal contract for safety obtained.Pt shown to their room.  

## 2020-03-10 NOTE — BHH Group Notes (Signed)
[facility] Group Notes:  (Nursing/MHT/Case Management/Adjunct)  Date:  03/10/2020  Time:  8:59 PM  Type of Therapy:  Group Therapy  Participation Level:  Did Not Attend  Summary of Progress/Problems:  Marissa Walsh 03/10/2020, 8:59 PM

## 2020-03-10 NOTE — Plan of Care (Signed)
New admission.  Problem: Education: Goal: Knowledge of [place] [facility] Education information/materials will improve Outcome: Not Progressing Goal: Emotional status will improve Outcome: Not Progressing Goal: Mental status will improve Outcome: Not Progressing Goal: Verbalization of understanding the information provided will improve Outcome: Not Progressing   Problem: Activity: Goal: Interest or engagement in activities will improve Outcome: Not Progressing Goal: Sleeping patterns will improve Outcome: Not Progressing   Problem: Coping: Goal: Ability to verbalize frustrations and anger appropriately will improve Outcome: Not Progressing Goal: Ability to demonstrate self-control will improve Outcome: Not Progressing   Problem: Health Behavior/Discharge Planning: Goal: Identification of resources available to assist in meeting health care needs will improve Outcome: Not Progressing Goal: Compliance with treatment plan for underlying cause of condition will improve Outcome: Not Progressing   Problem: Physical Regulation: Goal: Ability to maintain clinical measurements within normal limits will improve Outcome: Not Progressing   Problem: Safety: Goal: Periods of time without injury will increase Outcome: Not Progressing   Problem: Education: Goal: Ability to state activities that reduce stress will improve Outcome: Not Progressing   Problem: Coping: Goal: Ability to identify and develop effective coping behavior will improve Outcome: Not Progressing   Problem: Self-Concept: Goal: Ability to identify factors that promote anxiety will improve Outcome: Not Progressing Goal: Level of anxiety will decrease Outcome: Not Progressing Goal: Ability to modify response to factors that promote anxiety will improve Outcome: Not Progressing   Problem: Education: Goal: Ability to make informed decisions regarding treatment will improve Outcome: Not Progressing   Problem:  Coping: Goal: Coping ability will improve Outcome: Not Progressing   Problem: Health Behavior/Discharge Planning: Goal: Identification of resources available to assist in meeting health care needs will improve Outcome: Not Progressing   Problem: Medication: Goal: Compliance with prescribed medication regimen will improve Outcome: Not Progressing   Problem: Self-Concept: Goal: Ability to disclose and discuss suicidal ideas will improve Outcome: Not Progressing Goal: Will verbalize positive feelings about self Outcome: Not Progressing

## 2020-03-11 LAB — GLUCOSE, CAPILLARY: Glucose-Capillary: 114 mg/dL — ABNORMAL HIGH (ref 70–99)

## 2020-03-11 MED ORDER — OLANZAPINE 5 MG PO TABS
2.5000 mg | ORAL_TABLET | Freq: Four times a day (QID) | ORAL | Status: DC | PRN
  Administered 2020-03-11: 2.5 mg via ORAL
  Filled 2020-03-11: qty 1

## 2020-03-11 MED ORDER — OLANZAPINE 5 MG PO TABS
5.0000 mg | ORAL_TABLET | Freq: Every day | ORAL | Status: DC
  Administered 2020-03-11 – 2020-03-13 (×3): 5 mg via ORAL
  Filled 2020-03-11 (×3): qty 1

## 2020-03-11 NOTE — Progress Notes (Signed)
Patient hypotensive this morning and a rapid was called. BP continued to rise and they said to put her on bed rest and have a sitter present. Patient continued to improve and will be monitored. Patient currently has a Comptroller for safety and remains safe on the unit.

## 2020-03-11 NOTE — Progress Notes (Signed)
   03/11/20 5643  Clinical Encounter Type  Visited With Patient;Health care provider  Visit Type Initial  Referral From Nurse  Consult/Referral To Chaplain  Chaplain responded to a RR page. When she arrive to [facility] staff was working with patient, testing her sugar, checking pulse, and blood pressure. Most of the time patient was lethargic, but within about 15-20 minutes of chaplain arriving, patient asked to go to the bathroom. Chaplain left while staff took patient to the bathroom.

## 2020-03-11 NOTE — Significant Event (Signed)
Rapid Response Event Note  Overview: Time Called: 0614 Arrival Time: 0618 Event Type: Hypotension  Initial Focused Assessment:  RR called for pt with hypotension. Patient appears drowsy, but responds to voice and will answer questions and is appropriate.    Interventions: chart and history reviewed with care RN and AC; since pt's BP is improving, continue to monitor and encourage PO fluids.  Recommend bedrest until pt is more alert with assistance to get out of bed for now.    Plan of Care (if not transferred): care RN to have MD review medications for possible side effects causing increased lethargy and hypotension. Please call if further assistance is needed.  Event Summary:   at      at    Outcome: Stayed in room and stabalized  Event End Time: 0867  Marissa Walsh

## 2020-03-11 NOTE — BHH Counselor (Signed)
Adult Comprehensive Assessment  Patient ID: Marissa Walsh, female   DOB: 19-May-1981, 39 y.o.   MRN: [id number]  Information Source: Information source: Patient, Interpreter Ellyn Hack [phone])  Current Stressors:  Patient states their primary concerns and needs for treatment are:: "want to see psychiatrist" Patient states their goals for this hospitilization and ongoing recovery are:: "no" Educational / Learning stressors: Pt denies. Employment / Job issues: "gives me a lot of stress" Family Relationships: Pt denies. Financial / Lack of resources (include bankruptcy): "nothing big I just need to work[employer] / Lack of housing: "I don't know" Physical health (include injuries & life threatening diseases): "anemia" Social relationships: Pt denies. Substance abuse: Pt denies. Bereavement / Loss: Pt denies.  Living/Environment/Situation:  Living Arrangements: Children, Spouse/significant other Who else lives in the home?: "husband and children" How long has patient lived in current situation?: "1 year 4 months" What is atmosphere in current home: Comfortable, Loving, Supportive  Family History:  Marital status: Married Number of Years Married: 20 What types of issues is patient dealing with in the relationship?: Pt denies. Does patient have children?: Yes How many children?: 3 How is patient's relationship with their children?: "nice"  Childhood History:  By whom was/is the patient raised?: Both parents Description of patient's relationship with caregiver when they were a child: "not bad" Patient's description of current relationship with people who raised him/her: "not bad" How were you disciplined when you got in trouble as a child/adolescent?: "I don't know" Does patient have siblings?: No Did patient suffer any verbal/emotional/physical/sexual abuse as a child?: No Did patient suffer from severe childhood neglect?: No Has patient ever been sexually abused/assaulted/raped as an  adolescent or adult?: No Was the patient ever a victim of a crime or a disaster?: No Witnessed domestic violence?: No Has patient been affected by domestic violence as an adult?: No  Education:  Highest grade of school patient has completed: "middle school" Currently a student?: No Learning disability?: No  Employment/Work Situation:   Employment situation: Employed Where is patient currently employed?: 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 How long has patient been employed?: 5 years Has patient ever been in the [employer]?: No  Financial Resources:   Financial resources: Income from employment, Media planner, Income from spouse Does patient have a representative payee or guardian?: No  Alcohol/Substance Abuse:   What has been your use of drugs/alcohol within the last 12 months?: Pt denies. If attempted suicide, did drugs/alcohol play a role in this?: No Alcohol/Substance Abuse Treatment Hx: Denies past history Has alcohol/substance abuse ever caused legal problems?: No  Social Support System:   Patient's Community Support System: Good Describe Community Support System: "A lot of people help me" Type of faith/religion: Christian How does patient's faith help to cope with current illness?: "pray"  Leisure/Recreation:   Do You Have Hobbies?: Yes Leisure and Hobbies: "Singing"  Strengths/Needs:   What is the patient's perception of their strengths?: "music" Patient states these barriers may affect/interfere with their treatment: Pt denies. Patient states these barriers may affect their return to the community: Pt denies.  Discharge Plan:   Currently receiving community mental health services: Yes (From Whom) ([facility]) Patient states concerns and preferences for aftercare planning are: Pt reports plans to continue with aftercare. Patient states they will know when they are safe and ready for discharge when: "I believe myself" Does patient have access to  transportation?: Yes Does patient have financial barriers related to discharge medications?: No Will patient be  returning to same living situation after discharge?: Yes  Summary/Recommendations:   Summary and Recommendations (to be completed by the evaluator): Patient is a 39 year old female [place] [facility]  She presents to the hospital following concerns for lack of sleep, racing thoughts and statement of "let me die".  She has a primary diagnosis of Major Depressive Disorder, Severe, with psychotic features.  Recommendations include: crisis stabilization, therapeutic milieu, encourage group attendance and participation, medication management for detox/mood stabilization and development of comprehensive mental wellness/sobriety plan.  Rozann Lesches. 03/11/2020

## 2020-03-11 NOTE — Progress Notes (Signed)
Recreation Therapy Notes  INPATIENT RECREATION THERAPY ASSESSMENT  Patient Details Name: Marissa Walsh MRN: [id number] DOB: Apr 11, 1981 Today's Date: 03/11/2020       Information Obtained From: Chart Review  Able to Participate in Assessment/Interview:    Patient Presentation:    Reason for Admission (Per Patient): Active Symptoms, Suicidal Ideation  Patient Stressors:    Coping Skills:   Music  Leisure Interests (2+):  Music - Singing, Music - Listen  Frequency of Recreation/Participation:    Awareness of Community Resources:     Community Resources:     Current Use:    If no, Barriers?:    Expressed Interest in [employer] Information:    [place] of Residence:  Film/video editor  Patient Main Form of Transportation: Set designer  Patient Strengths:  Music  Patient Identified Areas of Improvement:  N/A  Patient Goal for Hospitalization:  N/A  Current SI (including self-harm):  No  Current HI:  No  Current AVH: No  Staff Intervention Plan: Group Attendance, Collaborate with Interdisciplinary Treatment Team  Consent to Intern Participation: N/A  Kleo Dungee 03/11/2020, 2:44 PM

## 2020-03-11 NOTE — Progress Notes (Signed)
Recreation Therapy Notes  Date: 03/11/2020  Time: 9:30 am   Location: Craft room   Behavioral response: N/A   Intervention Topic: Self- Care    Discussion/Intervention: Patient did not attend group.   Clinical Observations/Feedback:  Patient did not attend group.   Lillyanne Bradburn LRT/CTRS         Brylie Sneath 03/11/2020 11:53 AM

## 2020-03-11 NOTE — Progress Notes (Signed)
Used Interrupting services to assess patient.  Patient denies SI / HI / AVH. Patient is very minimal during assessment. Patient is discharged focused. Patient reports sadness and depression. Patient reports feeling better from this morning. Vital sign monitoring has shown B/P improvement today.

## 2020-03-11 NOTE — Progress Notes (Signed)
°   03/11/20 1340  Clinical Encounter Type  Visited With Patient  Visit Type Follow-up;Spiritual support;Social support;Behavioral Health  Referral From Chaplain  Consult/Referral To Chaplain  Visited Pt as a follow-up from ED. Pt was alert and talking. Ch asked Pt how she was doing, Pt said fine. The nurse said the Pt was doing a lot better. Ch will follow-up with Pt.

## 2020-03-11 NOTE — Plan of Care (Signed)
°  Problem: Education: °Goal: Knowledge of [place] [facility] Education information/materials will improve °Outcome: Not Progressing °Goal: Emotional status will improve °Outcome: Not Progressing °Goal: Mental status will improve °Outcome: Not Progressing °Goal: Verbalization of understanding the information provided will improve °Outcome: Not Progressing °  °

## 2020-03-11 NOTE — Progress Notes (Signed)
[facility] MD Progress Note   03/11/2020 2:05 PM Marissa Walsh  MRN:  [id number] Subjective: Patient seen and chart reviewed.  Follow-up for this 39 year old woman with what appears to probably be a major depression.  Patient's blood pressure dropped very low this morning.  Most likely explanation would be the Seroquel.  She did not have any falls and she denies any symptoms.  Blood pressure is now back up to normal.  Patient was interviewed entirely with the assistance of a Mandarin language hospital provided interpreter.  Patient said almost nothing in the interview except "I want to go home".  I tried to explore her anxieties and specific concerns.  She did admit that she remembered having made comments about wanting to die and was able to repeat her feeling that she felt that she was useless at home.  Nevertheless she was not able to follow this through into discussing a reasonable treatment plan.  She would only repeat that she wanted to go home.  Affect was rather childish and regressed during the course of the interview.  Patient denies any suicidal ideation.  Has not been participating much seems to be very withdrawn at times. Principal Problem: Severe major depression, single episode, with psychotic features ([place]) Diagnosis: Principal Problem:   Severe major depression, single episode, with psychotic features ([place]) Active Problems:   Anemia   Acute psychosis ([place])  Total Time spent with patient: 30 minutes  Past Psychiatric History: Past history of several visits to the emergency room recently around what seems to be this episode of depression  Past Medical History:  Past Medical History:  Diagnosis Date  . Asthma   . Bacterial vaginosis   . Ovarian cyst   . Thalassemia   . Vulvar dermatitis    History reviewed. No pertinent surgical history. Family History:  Family History  Problem Relation Age of Onset  . Hyperlipidemia Mother   . Hypertension Mother   . Diabetes Mother   . Cancer Father  31       colon cancer   Family Psychiatric  History: None known Social History:  Social History   Substance and Sexual Activity  Alcohol Use No     Social History   Substance and Sexual Activity  Drug Use No    Social History   Socioeconomic History  . Marital status: Married    Spouse name: Not on file  . Number of children: Not on file  . Years of education: Not on file  . Highest education level: Not on file  Occupational History  . Not on file  Tobacco Use  . Smoking status: Never Smoker  . Smokeless tobacco: Never Used  Substance and Sexual Activity  . Alcohol use: No  . Drug use: No  . Sexual activity: Never  Other Topics Concern  . Not on file  Social History Narrative   Working [employer].    Mandarin [place];    Has been in [place] for 2 years.    Lives with husband.   3 boys;  18, 9 and 10. no complications for pregnancies.         Social Determinants of Health   Financial Resource Strain:   . Difficulty of Paying Living Expenses:   Food Insecurity:   . Worried About Charity fundraiser in the Last Year:   . Arboriculturist in the Last Year:   Transportation Needs:   . Film/video editor (Medical):   Marland Kitchen Lack of  Transportation (Non-Medical):   Physical Activity:   . Days of Exercise per Week:   . Minutes of Exercise per Session:   Stress:   . Feeling of Stress :   Social Connections:   . Frequency of Communication with Friends and Family:   . Frequency of Social Gatherings with Friends and Family:   . Attends Religious Services:   . Active Member of Clubs or Organizations:   . Attends Banker Meetings:   Marland Kitchen Marital Status:    Additional Social History:                         Sleep: Fair  Appetite:  Fair  Current Medications: Current Facility-Administered Medications  Medication Dose Route Frequency Provider Last Rate Last Admin  . acetaminophen (TYLENOL) tablet 650 mg  650 mg Oral Q6H PRN  Gillermo Murdoch, NP      . alum & mag hydroxide-simeth (MAALOX/MYLANTA) 200-200-20 MG/5ML suspension 30 mL  30 mL Oral Q4H PRN Gillermo Murdoch, NP      . citalopram (CELEXA) tablet 10 mg  10 mg Oral Daily Lexus Barletta, Jackquline Denmark, MD   10 mg at 03/11/20 0733  . ferrous sulfate tablet 325 mg  325 mg Oral BID WC Gillermo Murdoch, NP   325 mg at 03/11/20 0733  . magnesium hydroxide (MILK OF MAGNESIA) suspension 30 mL  30 mL Oral Daily PRN Gillermo Murdoch, NP      . OLANZapine (ZYPREXA) tablet 2.5 mg  2.5 mg Oral Q6H PRN Arya Boxley T, MD      . OLANZapine (ZYPREXA) tablet 5 mg  5 mg Oral QHS Corryn Madewell T, MD      . traZODone (DESYREL) tablet 50 mg  50 mg Oral QHS PRN Gillermo Murdoch, NP        Lab Results:  Results for orders placed or performed during the hospital encounter of 03/10/20 (from the past 48 hour(s))  Glucose, capillary     Status: Abnormal   Collection Time: 03/11/20  6:23 AM  Result Value Ref Range   Glucose-Capillary 114 (H) 70 - 99 mg/dL    Comment: Glucose reference range applies only to samples taken after fasting for at least 8 hours.    Blood Alcohol level:  Lab Results  Component Value Date   ETH <10 03/09/2020   ETH <10 02/26/2020    Metabolic Disorder Labs: Lab Results  Component Value Date   HGBA1C 5.1 07/11/2016   No results found for: PROLACTIN Lab Results  Component Value Date   CHOL 188 07/11/2016   TRIG 92.0 07/11/2016   HDL 43.70 07/11/2016   CHOLHDL 4 07/11/2016   VLDL 18.4 07/11/2016   LDLCALC 125 (H) 07/11/2016    Physical Findings: AIMS:  , ,  ,  ,    CIWA:    COWS:     Musculoskeletal: Strength & Muscle Tone: within normal limits Gait & Station: normal Patient leans: N/A  Psychiatric Specialty Exam: Physical Exam  Nursing note and vitals reviewed. Constitutional: She appears well-developed.  HENT:  Head: Normocephalic and atraumatic.  Eyes: Pupils are equal, round, and reactive to light. Conjunctivae are  normal.  Cardiovascular: Normal heart sounds.  Respiratory: Effort normal.  GI: Soft.  Musculoskeletal:        General: Normal range of motion.     Cervical back: Normal range of motion.  Neurological: She is alert.  Skin: Skin is warm and dry.  Psychiatric: Her mood appears  anxious. Her affect is inappropriate. Her speech is tangential. She is agitated. She is not aggressive. Thought content is paranoid. She expresses impulsivity. She expresses no homicidal and no suicidal ideation. She is inattentive.    Review of Systems  Constitutional: Negative.   HENT: Negative.   Eyes: Negative.   Respiratory: Negative.   Cardiovascular: Negative.   Gastrointestinal: Negative.   Musculoskeletal: Negative.   Skin: Negative.   Neurological: Negative.   Psychiatric/Behavioral: The patient is nervous/anxious.     Blood pressure 106/82, pulse 90, temperature 97.9 F (36.6 C), temperature source Oral, resp. rate 18, height 5\' 2"  (1.575 m), weight 57.2 kg, last menstrual period 03/03/2020, SpO2 98 %.Body mass index is 23.05 kg/m.  General Appearance: Disheveled  Eye Contact:  Fair  Speech:  Slow  Volume:  Decreased  Mood:  Dysphoric  Affect:  Constricted and Tearful  Thought Process:  Disorganized  Orientation:  Full (Time, Place, and Person)  Thought Content:  Illogical, Rumination and Tangential  Suicidal Thoughts:  No  Homicidal Thoughts:  No  Memory:  Immediate;   Fair Recent;   Poor Remote;   Poor  Judgement:  Impaired  Insight:  Shallow  Psychomotor Activity:  Decreased  Concentration:  Concentration: Poor  Recall:  Fair  Fund of Knowledge:  Fair  Language:  Fair  Akathisia:  No  Handed:  Right  AIMS (if indicated):     Assets:  Desire for Improvement Financial Resources/Insurance Physical Health Resilience Social Support  ADL's:  Impaired  Cognition:  Impaired,  Mild  Sleep:  Number of Hours: 8     Treatment Plan Summary: Daily contact with patient to assess and  evaluate symptoms and progress in treatment, Medication management and Plan Patient has not been dangerous in her behavior but she did not tolerate the Seroquel and on interview today she has a troubling tendency to be regressed and unable to engage in rational conversation.  I did my best to try and be empathetic and explained my thinking to her but she just became more withdrawn.  I do not think it would be the safest idea to discharge her home at this point especially given the clear evidence that this is been a major problem for a couple months and has not responded to previous attempts at just giving it a pill and sending her home.  I will change her medicine to olanzapine 5 mg at night +2-1/2 mg as needed for anxiety.  Continue the antidepressant.  Encourage patient to try to attend groups and interact with staff.  Providers over the weekend can reassess if discharge is appropriate.  She has already been seen at [facility] and will be following up there I believe.  [facility], MD 03/11/2020, 2:05 PM

## 2020-03-12 LAB — TSH: TSH: 2.47 u[IU]/mL (ref 0.350–4.500)

## 2020-03-12 LAB — VITAMIN B12: Vitamin B-12: 499 pg/mL (ref 180–914)

## 2020-03-12 LAB — FOLATE: Folate: 20.9 ng/mL (ref >5.9)

## 2020-03-12 MED ORDER — CITALOPRAM HYDROBROMIDE 20 MG PO TABS
20.0000 mg | ORAL_TABLET | Freq: Every day | ORAL | Status: DC
  Administered 2020-03-13 – 2020-03-14 (×2): 20 mg via ORAL
  Filled 2020-03-12 (×2): qty 1

## 2020-03-12 MED ORDER — CLONAZEPAM 0.5 MG PO TABS
0.5000 mg | ORAL_TABLET | Freq: Two times a day (BID) | ORAL | Status: DC
  Administered 2020-03-12 – 2020-03-14 (×5): 0.5 mg via ORAL
  Filled 2020-03-12 (×5): qty 1

## 2020-03-12 MED ORDER — VITAMIN D 25 MCG (1000 UNIT) PO TABS
1000.0000 [IU] | ORAL_TABLET | Freq: Every day | ORAL | Status: DC
  Administered 2020-03-12 – 2020-03-14 (×3): 1000 [IU] via ORAL
  Filled 2020-03-12 (×3): qty 1

## 2020-03-12 MED ORDER — FOLIC ACID 1 MG PO TABS
1.0000 mg | ORAL_TABLET | Freq: Every day | ORAL | Status: DC
  Administered 2020-03-12 – 2020-03-14 (×3): 1 mg via ORAL
  Filled 2020-03-12 (×3): qty 1

## 2020-03-12 MED ORDER — CITALOPRAM HYDROBROMIDE 20 MG PO TABS
10.0000 mg | ORAL_TABLET | ORAL | Status: AC
  Administered 2020-03-12: 10 mg via ORAL
  Filled 2020-03-12: qty 1

## 2020-03-12 MED ORDER — THIAMINE HCL 100 MG PO TABS
100.0000 mg | ORAL_TABLET | Freq: Every day | ORAL | Status: DC
  Administered 2020-03-12 – 2020-03-14 (×3): 100 mg via ORAL
  Filled 2020-03-12 (×3): qty 1

## 2020-03-12 NOTE — Progress Notes (Signed)
The patient intermittently joined others in the milieu, attended a group, and accepted medications as ordered.  At times, she appeared anxious and sad. Marissa Walsh did not endorse any problematic side effects for added medications.  Adequate food and fluids consumed.  Translation services were used when appropriate.  Vitals signs were stable.

## 2020-03-12 NOTE — B[facility] Group Notes (Signed)
B[facility] Group Notes:  (Nursing/MHT/Case Management/Adjunct)  Date:  03/12/2020  Time:  8:43 PM  Type of Therapy:  Group Therapy  Participation Level:  Did Not Attend Summary of Progress/Problems:  Mayra Neer 03/12/2020, 8:43 PM

## 2020-03-12 NOTE — Plan of Care (Signed)
Patient pleasant, calm and cooperative on the unit  Problem: Education: Goal: Emotional status will improve Outcome: Progressing Goal: Mental status will improve Outcome: Progressing

## 2020-03-12 NOTE — Plan of Care (Signed)
The patient is cooperative and pleasant.  Problem: Education: Goal: Emotional status will improve Outcome: Progressing Goal: Mental status will improve Outcome: Progressing   

## 2020-03-12 NOTE — Progress Notes (Signed)
Patient was pleasant during assessment denying SI/HI/AVH, depression with this Clinical research associate. Pt endorses anxiety. Patient was observed interacting appropriately with staff and peers. Patient compliant with medication administration per MD orders. Patient given support, education and support to be active in her treatment plan. Patient being monitored Q 15 minutes for safety per unit protocol. Patient remains safe on the unit.

## 2020-03-12 NOTE — Tx Team (Signed)
Interdisciplinary Treatment and Diagnostic Plan Update  03/12/2020 Time of Session: 0910 Kathryn Cosby MRN: [id number]  Principal Diagnosis: Severe major depression, single episode, with psychotic features [facility])  Secondary Diagnoses: Principal Problem:   Severe major depression, single episode, with psychotic features (HCC) Active Problems:   Anemia   Acute psychosis (HCC)   Current Medications:  Current Facility-Administered Medications  Medication Dose Route Frequency Provider Last Rate Last Admin  . acetaminophen (TYLENOL) tablet 650 mg  650 mg Oral Q6H PRN Gillermo Murdoch, NP      . alum & mag hydroxide-simeth (MAALOX/MYLANTA) 200-200-20 MG/5ML suspension 30 mL  30 mL Oral Q4H PRN Gillermo Murdoch, NP      . citalopram (CELEXA) tablet 10 mg  10 mg Oral NOW Antonieta Pert, MD      . Melene Muller ON 03/13/2020] citalopram (CELEXA) tablet 20 mg  20 mg Oral Daily Antonieta Pert, MD      . clonazePAM Scarlette Calico) tablet 0.5 mg  0.5 mg Oral BID Antonieta Pert, MD      . ferrous sulfate tablet 325 mg  325 mg Oral BID WC Gillermo Murdoch, NP   325 mg at 03/12/20 0807  . folic acid (FOLVITE) tablet 1 mg  1 mg Oral Daily Antonieta Pert, MD      . magnesium hydroxide (MILK OF MAGNESIA) suspension 30 mL  30 mL Oral Daily PRN Gillermo Murdoch, NP      . OLANZapine (ZYPREXA) tablet 2.5 mg  2.5 mg Oral Q6H PRN Clapacs, Jackquline Denmark, MD   2.5 mg at 03/11/20 1730  . OLANZapine (ZYPREXA) tablet 5 mg  5 mg Oral QHS Clapacs, Jackquline Denmark, MD   5 mg at 03/11/20 2112  . thiamine tablet 100 mg  100 mg Oral Daily Antonieta Pert, MD      . traZODone (DESYREL) tablet 50 mg  50 mg Oral QHS PRN Gillermo Murdoch, NP   50 mg at 03/11/20 2112   PTA Medications: Medications Prior to Admission  Medication Sig Dispense Refill Last Dose  . ferrous sulfate 325 (65 FE) MG EC tablet Take 1 tablet (325 mg total) by mouth 2 (two) times daily with a meal. 60 tablet 2   . hydrOXYzine (ATARAX/VISTARIL) 10  MG tablet TAKE 1 TABLET (10 MG TOTAL) BY MOUTH 2 (TWO) TIMES DAILY AS NEEDED FOR ANXIETY. 180 tablet 1   . QUEtiapine (SEROQUEL) 25 MG tablet Take 1 tablet (25 mg total) by mouth at bedtime. 7 tablet 0   . traZODone (DESYREL) 50 MG tablet Take 2 tablets (100 mg total) by mouth at bedtime. 60 tablet 2     Patient Stressors: Marital or family conflict Medication change or noncompliance  Patient Strengths: Average or above average intelligence Financial means Physical Health Supportive family/friends Work skills  Treatment Modalities: Medication Management, Group therapy, Case management,  1 to 1 session with clinician, Psychoeducation, Recreational therapy.   Physician Treatment Plan for Primary Diagnosis: Severe major depression, single episode, with psychotic features (HCC) Long Term Goal(s): Improvement in symptoms so as ready for discharge Improvement in symptoms so as ready for discharge   Short Term Goals: Ability to verbalize feelings will improve Ability to disclose and discuss suicidal ideas Ability to demonstrate self-control will improve Ability to maintain clinical measurements within normal limits will improve Compliance with prescribed medications will improve  Medication Management: Evaluate patient's response, side effects, and tolerance of medication regimen.  Therapeutic Interventions: 1 to 1 sessions, Unit Group sessions and Medication administration.  Evaluation of Outcomes: Progressing  Physician Treatment Plan for Secondary Diagnosis: Principal Problem:   Severe major depression, single episode, with psychotic features ([place]) Active Problems:   Anemia   Acute psychosis ([place])  Long Term Goal(s): Improvement in symptoms so as ready for discharge Improvement in symptoms so as ready for discharge   Short Term Goals: Ability to verbalize feelings will improve Ability to disclose and discuss suicidal ideas Ability to demonstrate self-control will  improve Ability to maintain clinical measurements within normal limits will improve Compliance with prescribed medications will improve     Medication Management: Evaluate patient's response, side effects, and tolerance of medication regimen.  Therapeutic Interventions: 1 to 1 sessions, Unit Group sessions and Medication administration.  Evaluation of Outcomes: Progressing   RN Treatment Plan for Primary Diagnosis: Severe major depression, single episode, with psychotic features ([place]) Long Term Goal(s): Knowledge of disease and therapeutic regimen to maintain health will improve  Short Term Goals: Ability to identify and develop effective coping behaviors will improve and Compliance with prescribed medications will improve  Medication Management: RN will administer medications as ordered by provider, will assess and evaluate patient's response and provide education to patient for prescribed medication. RN will report any adverse and/or side effects to prescribing provider.  Therapeutic Interventions: 1 on 1 counseling sessions, Psychoeducation, Medication administration, Evaluate responses to treatment, Monitor vital signs and CBGs as ordered, Perform/monitor CIWA, COWS, AIMS and Fall Risk screenings as ordered, Perform wound care treatments as ordered.  Evaluation of Outcomes: Progressing   LCSW Treatment Plan for Primary Diagnosis: Severe major depression, single episode, with psychotic features ([place]) Long Term Goal(s): Safe transition to appropriate next level of care at discharge, Engage patient in therapeutic group addressing interpersonal concerns.  Short Term Goals: Engage patient in aftercare planning with referrals and resources, Increase social support and Increase skills for wellness and recovery  Therapeutic Interventions: Assess for all discharge needs, 1 to 1 time with Social worker, Explore available resources and support systems, Assess for adequacy in community support  network, Educate family and significant other(s) on suicide prevention, Complete Psychosocial Assessment, Interpersonal group therapy.  Evaluation of Outcomes: Progressing   Progress in Treatment: Attending groups: No. Participating in groups: No. Taking medication as prescribed: Yes. Toleration medication: Yes. Family/Significant other contact made: No, will contact:  husband Patient understands diagnosis: No. Discussing patient identified problems/goals with staff: Yes. Medical problems stabilized or resolved: Yes. Denies suicidal/homicidal ideation: Yes. Issues/concerns per patient self-inventory: No. Other: none  New problem(s) identified: No, Describe:  none  New Short Term/Long Term Goal(s):  Patient Goals:  "be more peaceful, stop mind from racing"  Discharge Plan or Barriers:   Reason for Continuation of Hospitalization: Hallucinations Medication stabilization Other; describe racing thoughts  Estimated Length of Stay: 2-4 days.  Attendees: Patient: Charlotta Lapaglia (interpretor used) 03/12/2020   Physician: Dr. Mallie Darting, MD 03/12/2020   Nursing: Al Corpus, RN 03/12/2020   RN Care Manager: 03/12/2020   Social Worker: Lurline Idol, LCSW 03/12/2020   Recreational Therapist:  03/12/2020   Other:  03/12/2020   Other:  03/12/2020  Other: 03/12/2020     Scribe for Treatment Team: Joanne Chars, [place] 03/12/2020 10:44 AM

## 2020-03-12 NOTE — BHH Group Notes (Signed)
LCSW Relapse Prevention and Social Support Group Note   03/12/2020 1300  Type of Group and Topic: Psychoeducational Group:  Relapse prevention and social support.   Participation Level:  minimal  Description of Group  Relapse prevention and developing social support group identifies mental health triggers and early warning signs as a first step towards developing appropriate coping skills.  This can include a relapse of mental health or substance use symptoms.  With the help of examples, patients are encouraged to recognize and intervene when symptoms return before a crisis occurs.  Patients are also engaged to evaluate their current support network and to consider ways to increase and broaden that network.    Therapeutic Goals 1. Patients will identify triggers and early symptoms related to both mental health and substance use relapses. 2. Patients will begin the process of identifying plans/coping skills to manage these symptoms before they escalate to a crisis. 3. Patients will consider individuals in their current support network, whether they are positive or negative supports, and look at options to increase the number of positive supports in their network.    Summary of Patient Progress: Pt speaks limited, but some english.  Pt was mostly quiet during group, but did ask to speak with CSW afterwards and talked about family and work stress, isolation (extended family all in [place]) and was tearful, wondering if the medication would help.  CSW listened and offered encouragement.      Therapeutic Modalities: Cognitive Behavioral Therapy Psychoeducation    Daleen Squibb, MSW, LCSW

## 2020-03-12 NOTE — Progress Notes (Signed)
[facility] MD Progress Note  03/12/2020 9:53 AM Marissa Walsh  MRN:  [id number] Subjective: Patient is a 39 year old female with a past psychiatric history of anxiety, poor sleep and racing thoughts who was admitted to the psychiatric unit on 03/10/2020 secondary to continuing worsening symptoms and perhaps psychotic thought disorder symptoms.  Objective: Patient is seen and examined.  Patient is a 39 year old female with the above-stated past psychiatric history who is seen in follow-up.  She was admitted on 03/10/2020 with sleeplessness, anxiety, racing thoughts and what sounds like thought insertion after she received the COVID-19 vaccination.  Today's visit I reviewed her history.  She stated that she had never had psychiatric symptoms before.  She stated she had never tried to hurt her self before.  She stated that within the last 2 weeks is when all of her symptoms occurred.  She denied any auditory or visual hallucinations, but her primary complaint today was racing thoughts.  She stated she felt as though people were "in my head".  The entire visit was done in Mandarin via the Manufacturing engineer.  She does understand [place] and did answer some questions in [place].  She lives with her husband and son.  She works at the family [employer].  She denied any external stressors prior to receiving the COVID-19 vaccination. This morning her blood pressure stable and she is mildly tachycardic with a rate of 117.  She had an episode where she became hypotensive last night.  Her blood pressure was 57/33 and repeat was 70/50.  That was approximately 6:14 PM.  She was afebrile at that time.  Her pulse oximetry was 99% on room air.  She stated that she felt good with regard to her sleep.  She stated the medication she had been given did help her sleep.  Review of her laboratories on admission revealed an elevated glucose at 167.  Otherwise normal electrolytes including liver function enzymes.  Her CBC revealed a decreased MCV  and MCH.  Her RDW was slightly elevated at 16.1.  On admission her acetaminophen was less than 10 and salicylate was less than 7.  Her pregnancy test was negative.  Blood alcohol was less than 10.  Drug screen was completely negative.  She is currently being treated with Celexa and olanzapine.  Review of the electronic medical record revealed previous treatment with ferrous sulfate, hydroxyzine and trazodone.  Despite reporting to me that she did not have any issues with psychiatric problems or anxiety review of the electronic medical record revealed that the patient had presented to her primary care nurse practitioner on 02/23/2020 and was felt to have anxiety symptoms and stress from "raising a family".  She agreed to a trial of trazodone and Atarax at that time.  Referral to psychiatry was made.  Also on 5/5 there is a conversation with her nurse practitioner that stated that she did take the trazodone a few times, but felt she needed a daytime medicine because she could not stop thinking.  It does not appear that a hemoglobin A1c has been done despite her elevated blood sugar.  She denied suicidal ideation.  She denied homicidal ideation.  She denied any auditory or visual hallucinations.  Principal Problem: Severe major depression, single episode, with psychotic features (HCC) Diagnosis: Principal Problem:   Severe major depression, single episode, with psychotic features (HCC) Active Problems:   Anemia   Acute psychosis (HCC)  Total Time spent with patient: 20 minutes  Past Psychiatric History: See admission H&P  Past  Medical History:  Past Medical History:  Diagnosis Date  . Asthma   . Bacterial vaginosis   . Ovarian cyst   . Thalassemia   . Vulvar dermatitis    History reviewed. No pertinent surgical history. Family History:  Family History  Problem Relation Age of Onset  . Hyperlipidemia Mother   . Hypertension Mother   . Diabetes Mother   . Cancer Father 76       colon cancer    Family Psychiatric  History: See admission H&P Social History:  Social History   Substance and Sexual Activity  Alcohol Use No     Social History   Substance and Sexual Activity  Drug Use No    Social History   Socioeconomic History  . Marital status: Married    Spouse name: Not on file  . Number of children: Not on file  . Years of education: Not on file  . Highest education level: Not on file  Occupational History  . Not on file  Tobacco Use  . Smoking status: Never Smoker  . Smokeless tobacco: Never Used  Substance and Sexual Activity  . Alcohol use: No  . Drug use: No  . Sexual activity: Never  Other Topics Concern  . Not on file  Social History Narrative   Working [employer], [employer].    Mandarin [place];    Has been in [place] for 2 years.    Lives with husband.   3 boys;  18, 9 and 10. no complications for pregnancies.         Social Determinants of Health   Financial Resource Strain:   . Difficulty of Paying Living Expenses:   Food Insecurity:   . Worried About Programme researcher, broadcasting/film/video in the Last Year:   . Barista in the Last Year:   Transportation Needs:   . Freight forwarder (Medical):   Marland Kitchen Lack of Transportation (Non-Medical):   Physical Activity:   . Days of Exercise per Week:   . Minutes of Exercise per Session:   Stress:   . Feeling of Stress :   Social Connections:   . Frequency of Communication with Friends and Family:   . Frequency of Social Gatherings with Friends and Family:   . Attends Religious Services:   . Active Member of Clubs or Organizations:   . Attends Banker Meetings:   Marland Kitchen Marital Status:    Additional Social History:                         Sleep: Good  Appetite:  Fair  Current Medications: Current Facility-Administered Medications  Medication Dose Route Frequency Provider Last Rate Last Admin  . acetaminophen (TYLENOL) tablet 650 mg  650 mg Oral Q6H PRN Gillermo Murdoch, NP      . alum & mag hydroxide-simeth (MAALOX/MYLANTA) 200-200-20 MG/5ML suspension 30 mL  30 mL Oral Q4H PRN Gillermo Murdoch, NP      . citalopram (CELEXA) tablet 10 mg  10 mg Oral Daily Clapacs, Jackquline Denmark, MD   10 mg at 03/12/20 0807  . ferrous sulfate tablet 325 mg  325 mg Oral BID WC Gillermo Murdoch, NP   325 mg at 03/12/20 0807  . magnesium hydroxide (MILK OF MAGNESIA) suspension 30 mL  30 mL Oral Daily PRN Gillermo Murdoch, NP      . OLANZapine (ZYPREXA) tablet 2.5 mg  2.5 mg Oral Q6H PRN Clapacs, Jackquline Denmark, MD  2.5 mg at 03/11/20 1730  . OLANZapine (ZYPREXA) tablet 5 mg  5 mg Oral QHS Clapacs, Madie Reno, MD   5 mg at 03/11/20 2112  . traZODone (DESYREL) tablet 50 mg  50 mg Oral QHS PRN Caroline Sauger, NP   50 mg at 03/11/20 2112    Lab Results:  Results for orders placed or performed during the hospital encounter of 03/10/20 (from the past 48 hour(s))  Glucose, capillary     Status: Abnormal   Collection Time: 03/11/20  6:23 AM  Result Value Ref Range   Glucose-Capillary 114 (H) 70 - 99 mg/dL    Comment: Glucose reference range applies only to samples taken after fasting for at least 8 hours.    Blood Alcohol level:  Lab Results  Component Value Date   ETH <10 03/09/2020   ETH <10 78/46/9629    Metabolic Disorder Labs: Lab Results  Component Value Date   HGBA1C 5.1 07/11/2016   No results found for: PROLACTIN Lab Results  Component Value Date   CHOL 188 07/11/2016   TRIG 92.0 07/11/2016   HDL 43.70 07/11/2016   CHOLHDL 4 07/11/2016   VLDL 18.4 07/11/2016   LDLCALC 125 (H) 07/11/2016    Physical Findings: AIMS:  , ,  ,  ,    CIWA:    COWS:     Musculoskeletal: Strength & Muscle Tone: within normal limits Gait & Station: normal Patient leans: N/A  Psychiatric Specialty Exam: Physical Exam  Nursing note and vitals reviewed. Constitutional: She is oriented to person, place, and time.  HENT:  Head: Normocephalic and atraumatic.   Respiratory: Effort normal.  GI: Normal appearance.  Neurological: She is alert and oriented to person, place, and time.    Review of Systems  Blood pressure (!) 120/96, pulse (!) 117, temperature 98.5 F (36.9 C), temperature source Oral, resp. rate 18, height 5\' 2"  (1.575 m), weight 57.2 kg, last menstrual period 03/03/2020, SpO2 97 %.Body mass index is 23.05 kg/m.  General Appearance: Disheveled  Eye Contact:  Good  Speech:  Normal Rate  Volume:  Decreased  Mood:  Anxious and Depressed  Affect:  Congruent  Thought Process:  Coherent and Descriptions of Associations: Intact  Orientation:  Full (Time, Place, and Person)  Thought Content:  Delusions and Rumination  Suicidal Thoughts:  No  Homicidal Thoughts:  No  Memory:  Immediate;   Fair Recent;   Fair Remote;   Fair  Judgement:  Intact  Insight:  Fair  Psychomotor Activity:  Decreased  Concentration:  Concentration: Fair and Attention Span: Fair  Recall:  [employer] of Knowledge:  Fair  Language:  Good  Akathisia:  Negative  Handed:  Right  AIMS (if indicated):     Assets:  Desire for Improvement Housing Resilience Social Support  ADL's:  Intact  Cognition:  WNL  Sleep:  Number of Hours: 8.5     Treatment Plan Summary: Daily contact with patient to assess and evaluate symptoms and progress in treatment, Medication management and Plan : Patient is seen and examined.  Patient is a 39 year old female with the above-stated past psychiatric history who is seen in follow-up.   Diagnosis: 1.  Unspecified psychosis. 2.  Generalized anxiety disorder. 3.  Major depression, single episode, severe with psychotic features 4.  Decreased MCV and MCH 5.  Hyperglycemia 6.  Insomnia  Pertinent findings on examination today: 1.  Continued racing thoughts. 2.  Continued anxiety. 3.  Continue possible thought insertion  Plan:  1.  Increase Celexa to 20 mg p.o. daily for anxiety and depression. 2.  Add clonazepam 0.5 mg p.o.  twice daily.  This is for anxiety, and will monitor for any drop in blood pressure or sedation. 3.  Continue Zyprexa 5 mg p.o. nightly and 2.5 mg p.o. every 6 hours as needed anxiety. 3.  Continue trazodone 50 mg p.o. nightly as needed insomnia. 4.  Continue ferrous sulfate 325 mg p.o. twice daily with food for anemia. 5.  Add folic acid 1 mg p.o. daily for nutritional supplementation. 6.  Add thiamine 100 mg p.o. daily for nutritional supplementation. 8.  Order hemoglobin A1c 9.  Disposition planning-in progress.  Antonieta Pert, MD 03/12/2020, 9:53 AM

## 2020-03-13 NOTE — Plan of Care (Signed)
Pt denies depression, anxiety, SI, HI and AVH. Pt was educated on care plan and verbalizes understanding. Pt was encouraged to attend groups. Torrie Mayers RN Problem: Education: Goal: Knowledge of [facility] Education information/materials will improve Outcome: Progressing Goal: Emotional status will improve Outcome: Progressing Goal: Mental status will improve Outcome: Progressing Goal: Verbalization of understanding the information provided will improve Outcome: Progressing   Problem: Activity: Goal: Interest or engagement in activities will improve Outcome: Progressing Goal: Sleeping patterns will improve Outcome: Progressing   Problem: Coping: Goal: Ability to verbalize frustrations and anger appropriately will improve Outcome: Progressing Goal: Ability to demonstrate self-control will improve Outcome: Progressing   Problem: Health Behavior/Discharge Planning: Goal: Identification of resources available to assist in meeting health care needs will improve Outcome: Progressing Goal: Compliance with treatment plan for underlying cause of condition will improve Outcome: Progressing   Problem: Physical Regulation: Goal: Ability to maintain clinical measurements within normal limits will improve Outcome: Progressing   Problem: Safety: Goal: Periods of time without injury will increase Outcome: Progressing   Problem: Education: Goal: Ability to state activities that reduce stress will improve Outcome: Progressing   Problem: Coping: Goal: Ability to identify and develop effective coping behavior will improve Outcome: Progressing   Problem: Self-Concept: Goal: Ability to identify factors that promote anxiety will improve Outcome: Progressing Goal: Level of anxiety will decrease Outcome: Progressing Goal: Ability to modify response to factors that promote anxiety will improve Outcome: Progressing   Problem: Education: Goal: Ability to make informed decisions  regarding treatment will improve Outcome: Progressing   Problem: Coping: Goal: Coping ability will improve Outcome: Progressing   Problem: Health Behavior/Discharge Planning: Goal: Identification of resources available to assist in meeting health care needs will improve Outcome: Progressing   Problem: Medication: Goal: Compliance with prescribed medication regimen will improve Outcome: Progressing   Problem: Self-Concept: Goal: Ability to disclose and discuss suicidal ideas will improve Outcome: Progressing Goal: Will verbalize positive feelings about self Outcome: Progressing

## 2020-03-13 NOTE — BHH Group Notes (Signed)
LCSW Group Therapy Note  03/13/2020   1:00 - 1:35 PM  Type of Therapy and Topic:  Group Therapy: Anger Cues and Responses  Participation Level: Active   Description of Group:   In this group, patients learned how to recognize the physical, cognitive, emotional, and behavioral responses they have to anger-provoking situations.  They identified a recent time they became angry and how they reacted.  They analyzed how their reaction was possibly beneficial and how it was possibly unhelpful.  The group discussed a variety of healthier coping skills that could help with such a situation in the future.  Focus was placed on how helpful it is to recognize the underlying emotions to our anger, because working on those can lead to a more permanent solution as well as our ability to focus on the important rather than the urgent.  Therapeutic Goals: 1. Patients will remember their last incident of anger and how they felt emotionally and physically, what their thoughts were at the time, and how they behaved. 2. Patients will identify how their behavior at that time worked for them, as well as how it worked against them. 3. Patients will explore possible new behaviors to use in future anger situations. 4. Patients will learn that anger itself is normal and cannot be eliminated, and that healthier reactions can assist with resolving conflict rather than worsening situations.  Summary of Patient Progress:  The patient was primarily quiet in group but appeared to be actively listening. Pt engaged in discussion once during initial question but pt spoke so softly that CSW was not exactly sure what she said.   Therapeutic Modalities:   Cognitive Behavioral Therapy  Shellia Cleverly

## 2020-03-13 NOTE — Plan of Care (Signed)
  Problem: Education: Goal: Knowledge of [facility] Education information/materials will improve Outcome: Progressing Goal: Emotional status will improve Outcome: Progressing Goal: Mental status will improve Outcome: Progressing Goal: Verbalization of understanding the information provided will improve Outcome: Progressing   Problem: Activity: Goal: Interest or engagement in activities will improve Outcome: Progressing Goal: Sleeping patterns will improve Outcome: Progressing   

## 2020-03-13 NOTE — Progress Notes (Signed)
Patient has been quiet and isolative to room and to self. Encouraged to come out of room for medications. Did not know what Zyprexa was for, and asked why she was only getting one pill. Did med education and instructed patient to ask for something for sleep if she couldn't sleep. Denies SI, HI and AVH. Affect is flat.

## 2020-03-13 NOTE — Progress Notes (Addendum)
D- Patient alert and oriented. Affect/mood is anxious and facial expression is blank. Denies SI, HI, AVH, and pain. Patient did got to group and go outside. She also spoke with her husband.   A- Scheduled medications administered to patient, per MD orders. Support and encouragement provided.  Routine safety checks conducted every 15 minutes.  Patient informed to notify staff with problems or concerns.  R- No adverse drug reactions noted. Patient contracts for safety at this time. Patient compliant with medications and treatment plan. Patient does however coplai of drowsiness. Patient receptive, calm, and cooperative. Patient interacts well with others on the unit.  Patient remains safe at this time.  Torrie Mayers RN

## 2020-03-13 NOTE — Progress Notes (Signed)
[facility] MD Progress Note  03/13/2020 9:06 AM Marissa Walsh  MRN:  [id number] Subjective: Patient is seen and examined.  Patient is a 39 year old female with the above-stated past psychiatric history who is seen in follow-up.  She is seen with the electronic mandrin translator today.  She stated she is much improved.  She stated she slept well.  She stated she did not feel as though anyone was controlling her thoughts.  She rated herself 80% better than she was on admission.  She was curious as to what I felt as though was the cause of all of this.  She denied any side effects to her current medications.  Yesterday her Celexa was increased, and clonazepam was added for anxiety.  She denied any side effects to her current medications.  Her vital signs are stable, she is afebrile.  Her pulse oximetry on room air was 98%.  She slept 8.25 hours last night.  Recent laboratories include a folic acid that came back normal at 20.9, and a B12 at 499.  THS also came back at 2.470 which was normal.  She denied any auditory or visual hallucinations.  She denied any suicidal or homicidal ideation.  Principal Problem: Severe major depression, single episode, with psychotic features (HCC) Diagnosis: Principal Problem:   Severe major depression, single episode, with psychotic features (HCC) Active Problems:   Anemia   Acute psychosis (HCC)  Total Time spent with patient: 15 minutes  Past Psychiatric History: See admission H&P  Past Medical History:  Past Medical History:  Diagnosis Date  . Asthma   . Bacterial vaginosis   . Ovarian cyst   . Thalassemia   . Vulvar dermatitis    History reviewed. No pertinent surgical history. Family History:  Family History  Problem Relation Age of Onset  . Hyperlipidemia Mother   . Hypertension Mother   . Diabetes Mother   . Cancer Father 49       colon cancer   Family Psychiatric  History: See admission H&P Social History:  Social History   Substance and Sexual Activity   Alcohol Use No     Social History   Substance and Sexual Activity  Drug Use No    Social History   Socioeconomic History  . Marital status: Married    Spouse name: Not on file  . Number of children: Not on file  . Years of education: Not on file  . Highest education level: Not on file  Occupational History  . Not on file  Tobacco Use  . Smoking status: Never Smoker  . Smokeless tobacco: Never Used  Substance and Sexual Activity  . Alcohol use: No  . Drug use: No  . Sexual activity: Never  Other Topics Concern  . Not on file  Social History Narrative   Working [employer].    Mandarin [place];    Has been in [place] for 2 years.    Lives with husband.   3 boys;  18, 9 and 10. no complications for pregnancies.         Social Determinants of Health   Financial Resource Strain:   . Difficulty of Paying Living Expenses:   Food Insecurity:   . Worried About Programme researcher, broadcasting/film/video in the Last Year:   . Barista in the Last Year:   Transportation Needs:   . Freight forwarder (Medical):   Marland Kitchen Lack of Transportation (Non-Medical):   Physical Activity:   . Days of Exercise  per Week:   . Minutes of Exercise per Session:   Stress:   . Feeling of Stress :   Social Connections:   . Frequency of Communication with Friends and Family:   . Frequency of Social Gatherings with Friends and Family:   . Attends Religious Services:   . Active Member of Clubs or Organizations:   . Attends Banker Meetings:   Marland Kitchen Marital Status:    Additional Social History:                         Sleep: Good  Appetite:  Fair  Current Medications: Current Facility-Administered Medications  Medication Dose Route Frequency Provider Last Rate Last Admin  . acetaminophen (TYLENOL) tablet 650 mg  650 mg Oral Q6H PRN Gillermo Murdoch, NP      . alum & mag hydroxide-simeth (MAALOX/MYLANTA) 200-200-20 MG/5ML suspension 30 mL  30 mL Oral Q4H PRN  Gillermo Murdoch, NP      . cholecalciferol (VITAMIN D3) tablet 1,000 Units  1,000 Units Oral Daily Antonieta Pert, MD   1,000 Units at 03/13/20 [phone]  . citalopram (CELEXA) tablet 20 mg  20 mg Oral Daily Antonieta Pert, MD   20 mg at 03/13/20 5277  . clonazePAM (KLONOPIN) tablet 0.5 mg  0.5 mg Oral BID Antonieta Pert, MD   0.5 mg at 03/13/20 8242  . ferrous sulfate tablet 325 mg  325 mg Oral BID WC Gillermo Murdoch, NP   325 mg at 03/13/20 0832  . folic acid (FOLVITE) tablet 1 mg  1 mg Oral Daily Antonieta Pert, MD   1 mg at 03/13/20 0831  . magnesium hydroxide (MILK OF MAGNESIA) suspension 30 mL  30 mL Oral Daily PRN Gillermo Murdoch, NP      . OLANZapine [facility]) tablet 2.5 mg  2.5 mg Oral Q6H PRN Clapacs, Jackquline Denmark, MD   2.5 mg at 03/11/20 1730  . OLANZapine (ZYPREXA) tablet 5 mg  5 mg Oral QHS Clapacs, Jackquline Denmark, MD   5 mg at 03/12/20 2123  . thiamine tablet 100 mg  100 mg Oral Daily Antonieta Pert, MD   100 mg at 03/13/20 0831  . traZODone (DESYREL) tablet 50 mg  50 mg Oral QHS PRN Gillermo Murdoch, NP   50 mg at 03/11/20 2112    Lab Results:  Results for orders placed or performed during the hospital encounter of 03/10/20 (from the past 48 hour(s))  Vitamin B12     Status: None   Collection Time: 03/12/20  7:26 PM  Result Value Ref Range   Vitamin B-12 499 180 - 914 pg/mL    Comment: (NOTE) This assay is not validated for testing neonatal or myeloproliferative syndrome specimens for Vitamin B12 levels. Performed at [facility], [address] [place], [place] [address]   Folate, serum, performed at [facility] lab     Status: None   Collection Time: 03/12/20  7:26 PM  Result Value Ref Range   Folate 20.9 >5.9 ng/mL    Comment: Performed at [facility], [address] [place], [place] [address]  TSH     Status: None   Collection Time: 03/12/20  7:26 PM  Result Value Ref Range   TSH 2.470 0.350 - 4.500 uIU/mL    Comment:  Performed by a 3rd Generation assay with a functional sensitivity of <=0.01 uIU/mL. Performed at [facility], [address], [place], [place] [address]  Blood Alcohol level:  Lab Results  Component Value Date   ETH <10 03/09/2020   ETH <10 02/26/2020    Metabolic Disorder Labs: Lab Results  Component Value Date   HGBA1C 5.1 07/11/2016   No results found for: PROLACTIN Lab Results  Component Value Date   CHOL 188 07/11/2016   TRIG 92.0 07/11/2016   HDL 43.70 07/11/2016   CHOLHDL 4 07/11/2016   VLDL 18.4 07/11/2016   LDLCALC 125 (H) 07/11/2016    Physical Findings: AIMS:  , ,  ,  ,    CIWA:    COWS:     Musculoskeletal: Strength & Muscle Tone: within normal limits Gait & Station: normal Patient leans: N/A  Psychiatric Specialty Exam: Physical Exam  Nursing note and vitals reviewed. Constitutional: She is oriented to person, place, and time.  HENT:  Head: Normocephalic and atraumatic.  Respiratory: Effort normal.  GI: Normal appearance.  Neurological: She is alert and oriented to person, place, and time.    Review of Systems  Blood pressure 117/88, pulse 65, temperature 98.1 F (36.7 C), temperature source Oral, resp. rate 18, height 5\' 2"  (1.575 m), weight 57.2 kg, last menstrual period 03/03/2020, SpO2 98 %.Body mass index is 23.05 kg/m.  General Appearance: Casual  Eye Contact:  Good  Speech:  Normal Rate  Volume:  Decreased  Mood:  Anxious  Affect:  Congruent  Thought Process:  Coherent and Descriptions of Associations: Intact  Orientation:  Full (Time, Place, and Person)  Thought Content:  Logical  Suicidal Thoughts:  No  Homicidal Thoughts:  No  Memory:  Immediate;   Fair Recent;   Fair Remote;   Fair  Judgement:  Intact  Insight:  Fair  Psychomotor Activity:  Normal  Concentration:  Concentration: Good and Attention Span: Good  Recall:  Good  Fund of Knowledge:  Fair  Language:  Good  Akathisia:  Negative  Handed:  Right   AIMS (if indicated):     Assets:  Desire for Improvement Resilience  ADL's:  Intact  Cognition:  WNL  Sleep:  Number of Hours: 8.25     Treatment Plan Summary: Daily contact with patient to assess and evaluate symptoms and progress in treatment, Medication management and Plan : Patient is seen and examined.  Patient is a 39 year old female with the above-stated past psychiatric history who is seen in follow-up.   Diagnosis: 1.  Unspecified psychosis. 2.  Generalized anxiety disorder. 3.  Major depression, single episode, severe with psychotic features 4.  Decreased MCV and MCH 5.  Hyperglycemia 6.  Insomnia  Pertinent findings on examination today: 1.  Decrease in racing thoughts. 2.  Decrease in anxiety. 3.  Patient reported decreased feelings of someone controlling her thoughts. 4.  Improvement in mood. 5.  Patient reported that she felt 8 out of 10 today.  Plan: 1.  Continue Celexa 20 mg p.o. daily for anxiety and depression. 2.  Continue clonazepam 0.5 mg p.o. twice daily standing for anxiety. 3.  Continue Zyprexa 5 mg p.o. nightly and 2.5 mg p.o. every 6 hours as needed anxiety or psychotic symptoms. 4.  Continue trazodone 50 mg p.o. nightly as needed insomnia. 5.  Continue ferrous sulfate 325 mg p.o. twice daily with food for anemia. 6.  Continue folic acid 1 mg p.o. daily for nutritional supplementation. 7.  Continue thiamine 100 mg p.o. daily for nutritional supplementation. 8.  Continue vitamin D3 at 1000 units p.o. daily for nutritional supplementation. 9.  Disposition planning-anticipate discharge in  1 to 2 days.   Sharma Covert, MD 03/13/2020, 9:06 AM

## 2020-03-14 LAB — HEMOGLOBIN A1C
Hgb A1c MFr Bld: 5 % (ref 4.8–5.6)
Mean Plasma Glucose: 97 mg/dL

## 2020-03-14 MED ORDER — VITAMIN D3 25 MCG PO TABS: 1000.0000 [IU] | ORAL_TABLET | Freq: Every day | ORAL | 1 refills | Status: AC

## 2020-03-14 MED ORDER — CITALOPRAM HYDROBROMIDE 20 MG PO TABS: 20.0000 mg | ORAL_TABLET | Freq: Every day | ORAL | 1 refills | Status: AC

## 2020-03-14 MED ORDER — CLONAZEPAM 0.5 MG PO TABS: 0.5000 mg | ORAL_TABLET | Freq: Two times a day (BID) | ORAL | 1 refills | Status: AC | PRN

## 2020-03-14 MED ORDER — CLONAZEPAM 0.5 MG PO TABS: 0.5000 mg | ORAL_TABLET | Freq: Two times a day (BID) | ORAL | Status: DC | PRN

## 2020-03-14 MED ORDER — OLANZAPINE 5 MG PO TABS: 5.0000 mg | ORAL_TABLET | Freq: Every day | ORAL | 1 refills | Status: AC

## 2020-03-14 MED ORDER — FERROUS SULFATE 325 (65 FE) MG PO TABS: 325.0000 mg | ORAL_TABLET | Freq: Two times a day (BID) | ORAL | 1 refills | Status: AC

## 2020-03-14 NOTE — Plan of Care (Signed)
  Problem: Group Participation Goal: STG - Patient will engage in groups without prompting or encouragement from LRT x3 group sessions within 5 recreation therapy group sessions Description: STG - Patient will engage in groups without prompting or encouragement from LRT x3 group sessions within 5 recreation therapy group sessions 03/14/2020 1452 by Ernest Haber, LRT Outcome: Not Applicable [id number] by Ernest Haber, LRT Outcome: Not Met (add Reason) Note: Patient did not attend any groups.

## 2020-03-14 NOTE — Progress Notes (Signed)
Patient is quiet and isolative to room and to self. Denies SI, HI and AVH

## 2020-03-14 NOTE — Progress Notes (Signed)
Recreation Therapy Notes   Date: 03/14/2020  Time: 9:30 am   Location: Outside   Behavioral response: N/A   Intervention Topic: Relaxation   Discussion/Intervention: Patient did not attend group.   Clinical Observations/Feedback:  Patient did not attend group.   Mikhael Hendriks LRT/CTRS        Marissa Walsh 03/14/2020 11:22 AM 

## 2020-03-14 NOTE — Discharge Summary (Signed)
Physician Discharge Summary Note  Patient:  Marissa Walsh is an 39 y.o., female MRN:  [id number] DOB:  [birth year] Patient phone:  [phone] (home)  Patient address:   [address] [place] [address],  Total Time spent with patient: 30 minutes  Date of Admission:  03/10/2020 Date of Discharge: 03/14/2020  Reason for Admission: Admitted after presenting to the emergency room with sleeplessness confusion agitation and anxiety statements of suicidal ideation  Principal Problem: Severe major depression, single episode, with psychotic features [facility]) Discharge Diagnoses: Principal Problem:   Severe major depression, single episode, with psychotic features ([place]) Active Problems:   Anemia   Acute psychosis ([place])   Past Psychiatric History: No previous hospitalization.  Several presentations to the emergency room in the last couple months.  Past Medical History:  Past Medical History:  Diagnosis Date   Asthma    Bacterial vaginosis    Ovarian cyst    Thalassemia    Vulvar dermatitis    History reviewed. No pertinent surgical history. Family History:  Family History  Problem Relation Age of Onset   Hyperlipidemia Mother    Hypertension Mother    Diabetes Mother    Cancer Father 72       colon cancer   Family Psychiatric  History: None reported Social History:  Social History   Substance and Sexual Activity  Alcohol Use No     Social History   Substance and Sexual Activity  Drug Use No    Social History   Socioeconomic History   Marital status: Married    Spouse name: Not on file   Number of children: Not on file   Years of education: Not on file   Highest education level: Not on file  Occupational History   Not on file  Tobacco Use   Smoking status: Never Smoker   Smokeless tobacco: Never Used  Substance and Sexual Activity   Alcohol use: No   Drug use: No   Sexual activity: Never  Other Topics Concern   Not on file  Social  History Narrative   Working [employer].    Mandarin [place];    Has been in [place] for 2 years.    Lives with husband.   3 boys;  18, 9 and 10. no complications for pregnancies.         Social Determinants of Health   Financial Resource Strain:    Difficulty of Paying Living Expenses:   Food Insecurity:    Worried About Charity fundraiser in the Last Year:    Arboriculturist in the Last Year:   Transportation Needs:    Film/video editor (Medical):    Lack of Transportation (Non-Medical):   Physical Activity:    Days of Exercise per Week:    Minutes of Exercise per Session:   Stress:    Feeling of Stress :   Social Connections:    Frequency of Communication with Friends and Family:    Frequency of Social Gatherings with Friends and Family:    Attends Religious Services:    Active Member of Clubs or Organizations:    Attends Archivist Meetings:    Marital Status:     Hospital Course: Admitted to psychiatric unit.  15-minute checks maintained.  Patient did not display any dangerous behavior in the hospital.  For the most part stayed withdrawn but was cooperative with treatment.  History provided was most consistent with major depression.  Patient was started  on citalopram and low-dose Zyprexa.  Tolerated medicine well.  Also started on as needed clonazepam.  At the time of discharge patient is denying any psychotic symptoms and denying any suicidal ideation.  She reports that her mood is feeling much better.  She expresses optimism and hope and is agreeable to outpatient treatment.  All interviews were conducted with the assistance of the hospital provided Mandarin language interpreter.  Patient is agreeable to staying on medication and following up with outpatient treatment in the community  Physical Findings: AIMS:  , ,  ,  ,    CIWA:    COWS:     Musculoskeletal: Strength & Muscle Tone: within normal limits Gait & Station:  normal Patient leans: N/A  Psychiatric Specialty Exam: Physical Exam  Nursing note and vitals reviewed. Constitutional: She appears well-developed.  HENT:  Head: Normocephalic and atraumatic.  Eyes: Pupils are equal, round, and reactive to light. Conjunctivae are normal.  Cardiovascular: Normal heart sounds.  Respiratory: Effort normal.  GI: Soft.  Musculoskeletal:        General: Normal range of motion.     Cervical back: Normal range of motion.  Neurological: She is alert.  Skin: Skin is warm and dry.  Psychiatric: Her behavior is normal. Mood, judgment and thought content normal.    Review of Systems  Constitutional: Negative.   HENT: Negative.   Eyes: Negative.   Respiratory: Negative.   Cardiovascular: Negative.   Gastrointestinal: Negative.   Musculoskeletal: Negative.   Skin: Negative.   Neurological: Negative.   Psychiatric/Behavioral: Negative.     Blood pressure 106/84, pulse 98, temperature 98 F (36.7 C), temperature source Oral, resp. rate 18, height 5\' 2"  (1.575 m), weight 57.2 kg, last menstrual period 03/03/2020, SpO2 98 %.Body mass index is 23.05 kg/m.  General Appearance: Casual  Eye Contact:  Good  Speech:  Clear and Coherent  Volume:  Normal  Mood:  Euthymic  Affect:  Congruent  Thought Process:  Goal Directed  Orientation:  Full (Time, Place, and Person)  Thought Content:  Logical  Suicidal Thoughts:  No  Homicidal Thoughts:  No  Memory:  Immediate;   Fair Recent;   Fair Remote;   Fair  Judgement:  Fair  Insight:  Fair  Psychomotor Activity:  Normal  Concentration:  Concentration: Fair  Recall:  Fair  Fund of Knowledge:  Fair  Language:  Fair  Akathisia:  No  Handed:  Right  AIMS (if indicated):     Assets:  Desire for Improvement  ADL's:  Intact  Cognition:  WNL  Sleep:  Number of Hours: 9     Have you used any form of tobacco in the last 30 days? (Cigarettes, Smokeless Tobacco, Cigars, and/or Pipes): No  Has this patient used  any form of tobacco in the last 30 days? (Cigarettes, Smokeless Tobacco, Cigars, and/or Pipes) Yes, No  Blood Alcohol level:  Lab Results  Component Value Date   ETH <10 03/09/2020   ETH <10 02/26/2020    Metabolic Disorder Labs:  Lab Results  Component Value Date   HGBA1C 5.0 03/12/2020   MPG 97 03/12/2020   No results found for: PROLACTIN Lab Results  Component Value Date   CHOL 188 07/11/2016   TRIG 92.0 07/11/2016   HDL 43.70 07/11/2016   CHOLHDL 4 07/11/2016   VLDL 18.4 07/11/2016   LDLCALC 125 (H) 07/11/2016    See Psychiatric Specialty Exam and Suicide Risk Assessment completed by Attending Physician prior to discharge.  Discharge  destination:  Home  Is patient on multiple antipsychotic therapies at discharge:  No   Has Patient had three or more failed trials of antipsychotic monotherapy by history:  No  Recommended Plan for Multiple Antipsychotic Therapies: NA  Discharge Instructions    Diet - low sodium heart healthy   Complete by: As directed    Increase activity slowly   Complete by: As directed      Allergies as of 03/14/2020      Reactions   Shellfish Allergy Itching   Alcohol-sulfur [sulfur] Itching      Medication List    STOP taking these medications   ferrous sulfate 325 (65 FE) MG EC tablet Replaced by: ferrous sulfate 325 (65 FE) MG tablet   hydrOXYzine 10 MG tablet Commonly known as: ATARAX/VISTARIL   QUEtiapine 25 MG tablet Commonly known as: SEROquel   traZODone 50 MG tablet Commonly known as: DESYREL     TAKE these medications     Indication  citalopram 20 MG tablet Commonly known as: CELEXA Take 1 tablet (20 mg total) by mouth daily. Start taking on: March 15, 2020  Indication: Depression   clonazePAM 0.5 MG tablet Commonly known as: KLONOPIN Take 1 tablet (0.5 mg total) by mouth 2 (two) times daily as needed (anxiety).  Indication: Feeling Anxious   ferrous sulfate 325 (65 FE) MG tablet Take 1 tablet (325 mg total)  by mouth 2 (two) times daily with a meal. Replaces: ferrous sulfate 325 (65 FE) MG EC tablet  Indication: Anemia From Inadequate Iron in the Body   OLANZapine 5 MG tablet Commonly known as: ZYPREXA Take 1 tablet (5 mg total) by mouth at bedtime.  Indication: Major Depressive Disorder   Vitamin D3 25 MCG tablet Commonly known as: Vitamin D Take 1 tablet (1,000 Units total) by mouth daily. Start taking on: March 15, 2020  Indication: Vitamin D Deficiency       Follow-up Information    [facility] Follow up.   Why: Hospital discharge appointment is scheduled for 03/18/20 at 12:30pm.  Psychiatrist appointment is 04/12/2020, they are trying to reschedule appointment to a earlier date.  Thanks!! Contact information: [address] [place] [address] [phone]               Follow-up recommendations:  Activity:  Activity as tolerated Diet:  Regular diet Other:  Follow-up with outpatient treatment  Comments: Prescriptions provided at discharge  Signed: Mordecai Rasmussen, MD 03/14/2020, 2:26 PM

## 2020-03-14 NOTE — Progress Notes (Signed)
D:Patient denies SI/HI/AVH, able to contract for safety at this time. Pt appears calm and cooperative, and no distress noted.   A: All Personal items in locker returned to pt. Upon discharge.   R:  Pt States she will comply with discharge planning put into place and take MEDS as prescribed. All AVS elements were reviewed with the patient. Pt escorted out of the building by this staff.

## 2020-03-14 NOTE — Progress Notes (Signed)
Recreation Therapy Notes  INPATIENT RECREATION TR PLAN  Patient Details Name: Marissa Walsh MRN: [id number] DOB: [birth year] Today's Date: 03/14/2020  Rec Therapy Plan Is patient appropriate for Therapeutic Recreation?: Yes Treatment times per week: at least 3 Estimated Length of Stay: 5-7 days TR Treatment/Interventions: Group participation (Comment)  Discharge Criteria Pt will be discharged from therapy if:: Discharged Treatment plan/goals/alternatives discussed and agreed upon by:: Patient/family  Discharge Summary Short term goals set: Patient will engage in groups without prompting or encouragement from LRT x3 group sessions within 5 recreation therapy group sessions Short term goals met: Not met Reason goals not met: Patient did not attend any groups Therapeutic equipment acquired: N/A Reason patient discharged from therapy: Discharge from hospital Pt/family agrees with progress & goals achieved: Yes Date patient discharged from therapy: 03/14/20   Lional Icenogle 03/14/2020, 2:54 PM

## 2020-03-14 NOTE — Plan of Care (Signed)
D: Pt. During assessments this morning observed in her room with interpreter services [phone] machine. Pt. During our interaction was pleasant and cooperative, able to participate actively with interpreter machine as staff aid. Pt. Denied physical pain and verbalized tolerating her medicines thus far (no gi upset or other abnormalities verbalized). Pt. Denied si/hi/avh, and verbalized ability to continue to remain safe on the unit. Pt. Endorsed her mood as, "good". Pt. Affect mildly constricted with some mild decreased lability. Pt. Did not appear to be RTIS during our interaction. Pt. Orientation was intact upon assessment. Pt. And this Clinical research associate reviewed with interpreter services patient medications.   A: Q x 15 minute observation checks in place/maintained for safety. Patient is provided with education throughout shift when appropriate and able.  Patient is given/offered medications per orders. Patient is encouraged to attend groups, participate in unit activities and continue with plan of care. Pt. Chart and plans of care reviewed. Pt. Given support and encouragement when appropriate and able.    R: Patient is complaint with medication and unit procedures thus far. Pt. Observed eating good, observed up for breakfast. Pt. Besides scheduled medicines and breakfast, patient has been observed withdrawn to room resting in bed. Pt. Thus far has not been a behavioral concern.      Problem: Education: Goal: Knowledge of [place] [facility] Education information/materials will improve 03/14/2020 1009 by Lenox Ponds, RN Outcome: Progressing 03/14/2020 0946 by Lenox Ponds, RN Outcome: Progressing Goal: Emotional status will improve 03/14/2020 1009 by Lenox Ponds, RN Outcome: Progressing 03/14/2020 0946 by Lenox Ponds, RN Outcome: Progressing Goal: Mental status will improve 03/14/2020 1009 by Lenox Ponds, RN Outcome: Progressing 03/14/2020 0946 by Lenox Ponds, RN Outcome:  Progressing Goal: Verbalization of understanding the information provided will improve 03/14/2020 1009 by Lenox Ponds, RN Outcome: Progressing 03/14/2020 0946 by Lenox Ponds, RN Outcome: Progressing   Problem: Activity: Goal: Interest or engagement in activities will improve 03/14/2020 1009 by Lenox Ponds, RN Outcome: Progressing 03/14/2020 0946 by Lenox Ponds, RN Outcome: Progressing Goal: Sleeping patterns will improve 03/14/2020 1009 by Lenox Ponds, RN Outcome: Progressing 03/14/2020 0946 by Lenox Ponds, RN Outcome: Progressing   Problem: Coping: Goal: Ability to verbalize frustrations and anger appropriately will improve 03/14/2020 1009 by Lenox Ponds, RN Outcome: Progressing 03/14/2020 0946 by Lenox Ponds, RN Outcome: Progressing Goal: Ability to demonstrate self-control will improve 03/14/2020 1009 by Lenox Ponds, RN Outcome: Progressing 03/14/2020 0946 by Lenox Ponds, RN Outcome: Progressing   Problem: Health Behavior/Discharge Planning: Goal: Identification of resources available to assist in meeting health care needs will improve 03/14/2020 1009 by Lenox Ponds, RN Outcome: Progressing 03/14/2020 0946 by Lenox Ponds, RN Outcome: Progressing Goal: Compliance with treatment plan for underlying cause of condition will improve 03/14/2020 1009 by Lenox Ponds, RN Outcome: Progressing 03/14/2020 0946 by Lenox Ponds, RN Outcome: Progressing   Problem: Physical Regulation: Goal: Ability to maintain clinical measurements within normal limits will improve 03/14/2020 1009 by Lenox Ponds, RN Outcome: Progressing 03/14/2020 0946 by Lenox Ponds, RN Outcome: Progressing   Problem: Safety: Goal: Periods of time without injury will increase 03/14/2020 1009 by Lenox Ponds, RN Outcome: Progressing 03/14/2020 0946 by Lenox Ponds, RN Outcome: Progressing   Problem: Education: Goal: Ability to state  activities that reduce stress will improve 03/14/2020 1009 by Lenox Ponds, RN Outcome: Progressing 03/14/2020 0946 by Lenox Ponds, RN Outcome: Progressing  Problem: Coping: Goal: Ability to identify and develop effective coping behavior will improve 03/14/2020 1009 by Reyes Ivan, RN Outcome: Progressing 03/14/2020 0946 by Reyes Ivan, RN Outcome: Progressing   Problem: Self-Concept: Goal: Ability to identify factors that promote anxiety will improve 03/14/2020 1009 by Reyes Ivan, RN Outcome: Progressing 03/14/2020 0946 by Reyes Ivan, RN Outcome: Progressing Goal: Level of anxiety will decrease 03/14/2020 1009 by Reyes Ivan, RN Outcome: Progressing 03/14/2020 0946 by Reyes Ivan, RN Outcome: Progressing Goal: Ability to modify response to factors that promote anxiety will improve 03/14/2020 1009 by Reyes Ivan, RN Outcome: Progressing 03/14/2020 0946 by Reyes Ivan, RN Outcome: Progressing   Problem: Education: Goal: Ability to make informed decisions regarding treatment will improve 03/14/2020 1009 by Reyes Ivan, RN Outcome: Progressing 03/14/2020 0946 by Reyes Ivan, RN Outcome: Progressing   Problem: Coping: Goal: Coping ability will improve 03/14/2020 1009 by Reyes Ivan, RN Outcome: Progressing 03/14/2020 0946 by Reyes Ivan, RN Outcome: Progressing   Problem: Health Behavior/Discharge Planning: Goal: Identification of resources available to assist in meeting health care needs will improve 03/14/2020 1009 by Reyes Ivan, RN Outcome: Progressing 03/14/2020 0946 by Reyes Ivan, RN Outcome: Progressing   Problem: Medication: Goal: Compliance with prescribed medication regimen will improve 03/14/2020 1009 by Reyes Ivan, RN Outcome: Progressing 03/14/2020 0946 by Reyes Ivan, RN Outcome: Progressing   Problem: Self-Concept: Goal: Ability to disclose and discuss suicidal ideas will  improve 03/14/2020 1009 by Reyes Ivan, RN Outcome: Progressing 03/14/2020 0946 by Reyes Ivan, RN Outcome: Progressing Goal: Will verbalize positive feelings about self 03/14/2020 1009 by Reyes Ivan, RN Outcome: Progressing 03/14/2020 0946 by Reyes Ivan, RN Outcome: Progressing

## 2020-03-14 NOTE — Progress Notes (Signed)
  [facility] Adult Case Management Discharge Plan :  Will you be returning to the same living situation after discharge:  Yes,  pt reports that she is returning home. At discharge, do you have transportation home?: Yes,  pt reports that her husband will pick her up.  Do you have the ability to pay for your medications: Yes,  BC[employer]  Release of information consent forms completed and in the chart;  Patient's signature needed at discharge.  Patient to Follow up at:  Follow-up Information    [facility] Follow up.   Why: Hospital discharge appointment is scheduled for 03/18/20 at 12:30pm.  Psychiatrist appointment is 04/12/2020, they are trying to reschedule appointment to a earlier date.  Thanks!! Contact information: [address] [place] [address] [phone]               Next level of care provider has access to [facility] Link:no  Safety Planning and Suicide Prevention discussed: Yes,  SPE completed with the patient.  Have you used any form of tobacco in the last 30 days? (Cigarettes, Smokeless Tobacco, Cigars, and/or Pipes): No  Has patient been referred to the Quitline?: Patient refused referral  Patient has been referred for addiction treatment: Pt. refused referral  Harden Mo, LCSW 03/14/2020, 2:29 PM

## 2020-03-14 NOTE — [facility] Suicide Risk Assessment (Signed)
[facility] INPATIENT:  Family/Significant Other Suicide Prevention Education  Suicide Prevention Education:  CSW notes that conversation was completed with use of interpreter [phone].  Education Completed; Rocky Crafts husband, [phone] has been identified by the patient as the family member/significant other with whom the patient will be residing, and identified as the person(s) who will aid the patient in the event of a mental health crisis (suicidal ideations/suicide attempt).  With written consent from the patient, the family member/significant other has been provided the following suicide prevention education, prior to the and/or following the discharge of the patient.  The suicide prevention education provided includes the following:  Suicide risk factors  Suicide prevention and interventions  National Suicide Hotline telephone number  [facility] assessment telephone number  [facility] Emergency Assistance 911  [facility] and/or Residential Mobile Crisis Unit telephone number  Request made of family/significant other to:  Remove weapons (e.g., guns, rifles, knives), all items previously/currently identified as safety concern.    Remove drugs/medications (over-the-counter, prescriptions, illicit drugs), all items previously/currently identified as a safety concern.  The family member/significant other verbalizes understanding of the suicide prevention education information provided.  The family member/significant other agrees to remove the items of safety concern listed above.  Pt husband reports that patient has been "stuggling with a psychiatric illness".  His weapons are locked in the safe and pt does not have access to them.  He reports concerns that "it all seemed to happen all of a sudden".    Harden Mo 03/14/2020, 10:15 AM

## 2020-03-14 NOTE — [facility] Suicide Risk Assessment (Signed)
[facility] Discharge Suicide Risk Assessment   Principal Problem: Severe major depression, single episode, with psychotic features [facility]) Discharge Diagnoses: Principal Problem:   Severe major depression, single episode, with psychotic features (HCC) Active Problems:   Anemia   Acute psychosis (HCC)   Total Time spent with patient: 30 minutes  Musculoskeletal: Strength & Muscle Tone: within normal limits Gait & Station: normal Patient leans: N/A  Psychiatric Specialty Exam: Review of Systems  Constitutional: Negative.   HENT: Negative.   Eyes: Negative.   Respiratory: Negative.   Cardiovascular: Negative.   Gastrointestinal: Negative.   Musculoskeletal: Negative.   Skin: Negative.   Neurological: Negative.   Psychiatric/Behavioral: Negative.     Blood pressure 106/84, pulse 98, temperature 98 F (36.7 C), temperature source Oral, resp. rate 18, height 5\' 2"  (1.575 m), weight 57.2 kg, last menstrual period 03/03/2020, SpO2 98 %.Body mass index is 23.05 kg/m.  General Appearance: Casual  Eye Contact::  Good  Speech:  Clear and Coherent409  Volume:  Normal  Mood:  Euthymic  Affect:  Congruent  Thought Process:  Goal Directed  Orientation:  Full (Time, Place, and Person)  Thought Content:  Logical  Suicidal Thoughts:  No  Homicidal Thoughts:  No  Memory:  Immediate;   Fair Recent;   Fair Remote;   Fair  Judgement:  Fair  Insight:  Fair  Psychomotor Activity:  Normal  Concentration:  Fair  Recall:  [phone] of Knowledge:Fair  Language: Fair  Akathisia:  No  Handed:  Right  AIMS (if indicated):     Assets:  Desire for Improvement Housing Physical Health Resilience Social Support  Sleep:  Number of Hours: 9  Cognition: WNL  ADL's:  Intact   Mental Status Per Nursing Assessment::   On Admission:  Suicidal ideation indicated by patient, Suicidal ideation indicated by others  Demographic Factors:  NA  Loss Factors: NA  Historical Factors: NA  Risk  Reduction Factors:   Responsible for children under 19 years of age, Sense of responsibility to family, Employed, Living with another person, especially a relative, Positive social support and Positive therapeutic relationship  Continued Clinical Symptoms:  Depression:   Impulsivity  Cognitive Features That Contribute To Risk:  None    Suicide Risk:  Minimal: No identifiable suicidal ideation.  Patients presenting with no risk factors but with morbid ruminations; may be classified as minimal risk based on the severity of the depressive symptoms   Follow-up Information    [facility] Follow up.   Why: Hospital discharge appointment is scheduled for 03/18/20 at 12:30pm.  Psychiatrist appointment is 04/12/2020, they are trying to reschedule appointment to a earlier date.  Thanks!! Contact information: [address] [place] [phone]               Plan Of Care/Follow-up recommendations:  Activity:  Activity as tolerated Diet:  Regular diet Other:  Follow-up outpatient treatment as indicated  [phone], MD 03/14/2020, 2:12 PM

## 2020-03-14 NOTE — Plan of Care (Signed)
Care planning complete.    Problem: Education: Goal: Knowledge of [place] [facility] Education information/materials will improve 03/14/2020 1504 by Reyes Ivan, RN Outcome: Completed/Met 03/14/2020 1049 by Reyes Ivan, RN Outcome: Progressing 03/14/2020 1009 by Reyes Ivan, RN Outcome: Progressing 03/14/2020 0946 by Reyes Ivan, RN Outcome: Progressing Goal: Emotional status will improve 03/14/2020 1504 by Reyes Ivan, RN Outcome: Completed/Met 03/14/2020 1049 by Reyes Ivan, RN Outcome: Progressing 03/14/2020 1009 by Reyes Ivan, RN Outcome: Progressing 03/14/2020 0946 by Reyes Ivan, RN Outcome: Progressing Goal: Mental status will improve 03/14/2020 1504 by Reyes Ivan, RN Outcome: Completed/Met 03/14/2020 1049 by Reyes Ivan, RN Outcome: Progressing 03/14/2020 1009 by Reyes Ivan, RN Outcome: Progressing 03/14/2020 0946 by Reyes Ivan, RN Outcome: Progressing Goal: Verbalization of understanding the information provided will improve 03/14/2020 1504 by Reyes Ivan, RN Outcome: Completed/Met 03/14/2020 1049 by Reyes Ivan, RN Outcome: Progressing 03/14/2020 1009 by Reyes Ivan, RN Outcome: Progressing 03/14/2020 0946 by Reyes Ivan, RN Outcome: Progressing

## 2020-03-14 NOTE — Plan of Care (Signed)
  Problem: Education: Goal: Knowledge of [place] [facility] Education information/materials will improve Outcome: Progressing Goal: Emotional status will improve Outcome: Progressing Goal: Mental status will improve Outcome: Progressing Goal: Verbalization of understanding the information provided will improve Outcome: Progressing   

## 2020-03-21 ENCOUNTER — Other Ambulatory Visit: Payer: Self-pay | Admitting: Psychiatry

## 2020-04-05 ENCOUNTER — Other Ambulatory Visit: Payer: Self-pay | Admitting: Psychiatry

## 2020-05-10 ENCOUNTER — Other Ambulatory Visit: Payer: Self-pay | Admitting: Family

## 2020-05-10 DIAGNOSIS — D509 Iron deficiency anemia, unspecified: Secondary | ICD-10-CM

## 2020-06-22 ENCOUNTER — Other Ambulatory Visit: Payer: Self-pay | Admitting: Psychiatry

## 2020-08-24 ENCOUNTER — Other Ambulatory Visit: Payer: Self-pay | Admitting: Family

## 2020-08-24 DIAGNOSIS — F419 Anxiety disorder, unspecified: Secondary | ICD-10-CM

## 2020-08-24 DIAGNOSIS — F32A Depression, unspecified: Secondary | ICD-10-CM

## 2020-12-03 IMAGING — MR MR BRAIN/IAC WO/W CM
12 of 17 series · 28 of 48 positions shown · IV contrast (gadavist)
Comparison: Noncontrast head CT 01/12/2019

CLINICAL DATA: Encephalopathy; encephalitis.

EXAM:
MRI HEAD WITHOUT AND WITH CONTRAST
TECHNIQUE: Multiplanar, multiecho pulse sequences of the brain and surrounding
structures were obtained without and with intravenous contrast.
CONTRAST:  6mL GADAVIST GADOBUTROL 1 MMOL/ML IV SOLN

[Series 5: T1 · sagittal · 5.0mm · 0.62mm/px · 1 of 21 slices shown (1 of 4)]
[im 1/21]
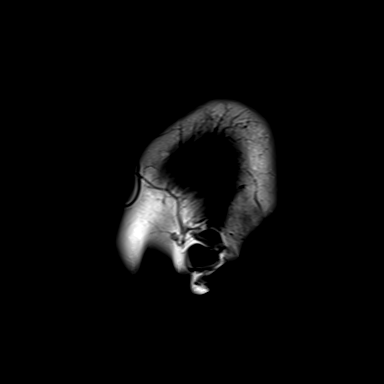

[Series 6: ax dwi_tracew · axial · 3.0mm · 0.60mm/px · z∈[-77,+77]mm · 3 of 48 slices shown]
[im 1/48]
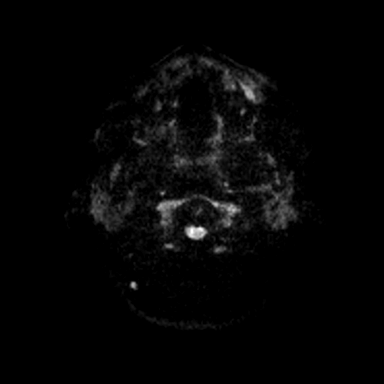
[im 24/48]
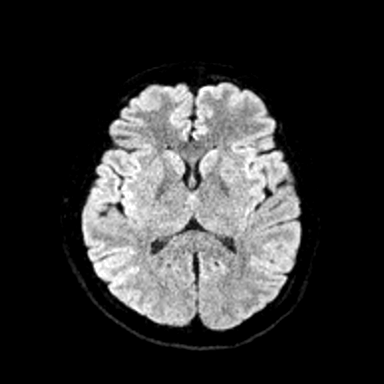
[im 48/48]
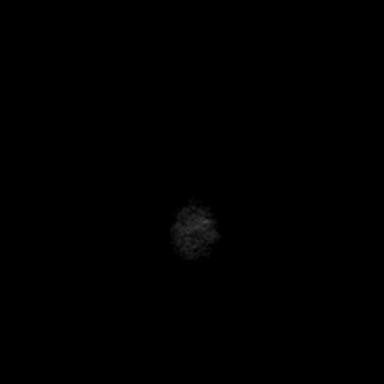

[Series 7: ax dwi_adc · axial · 3.0mm · 0.60mm/px · 1 of 48 slices shown]
[im 1/48]
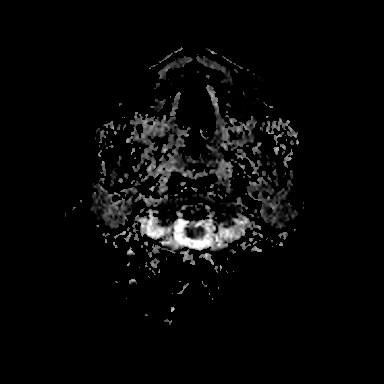

[Series 8: FLAIR · axial · 3.0mm · 0.53mm/px · z∈[-81,+81]mm · 4 of 55 slices shown]
[im 1/55]
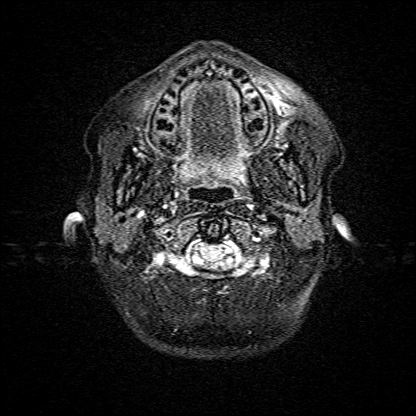
[im 19/55]
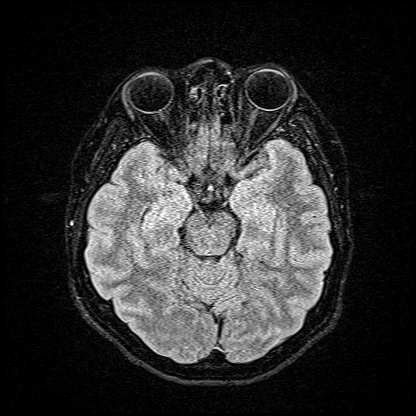
[im 37/55]
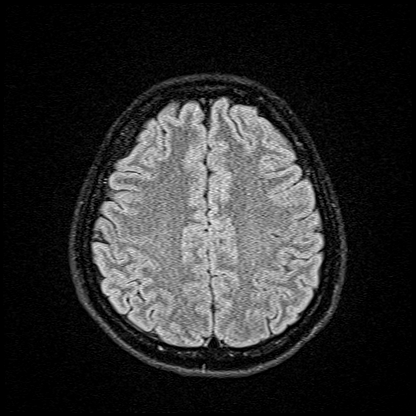
[im 55/55]
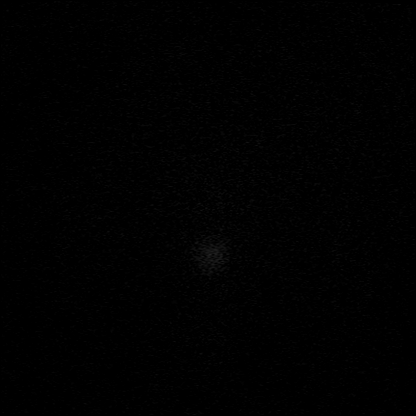

[Series 11: T2 · axial · 5.0mm · 0.53mm/px · z∈[-72,+72]mm · 2 of 25 slices shown]
[im 1/25]
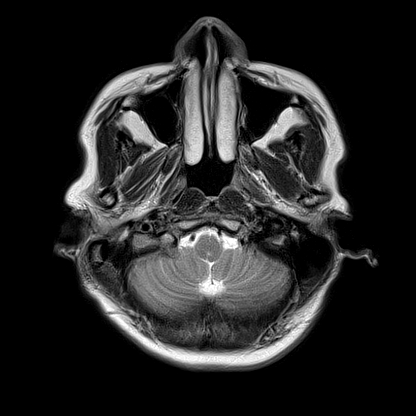
[im 25/25]
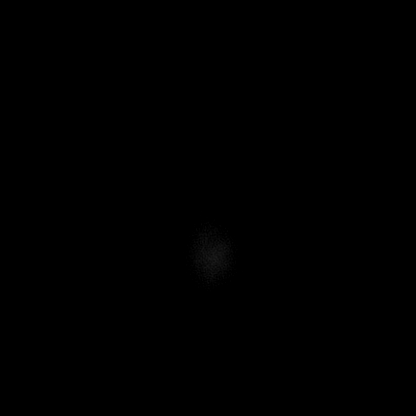

[Series 16: T1 · coronal · non-contrast · 3.0mm · 0.21mm/px · 1 of 13 slices shown (2 of 4)]
[im 1/13]
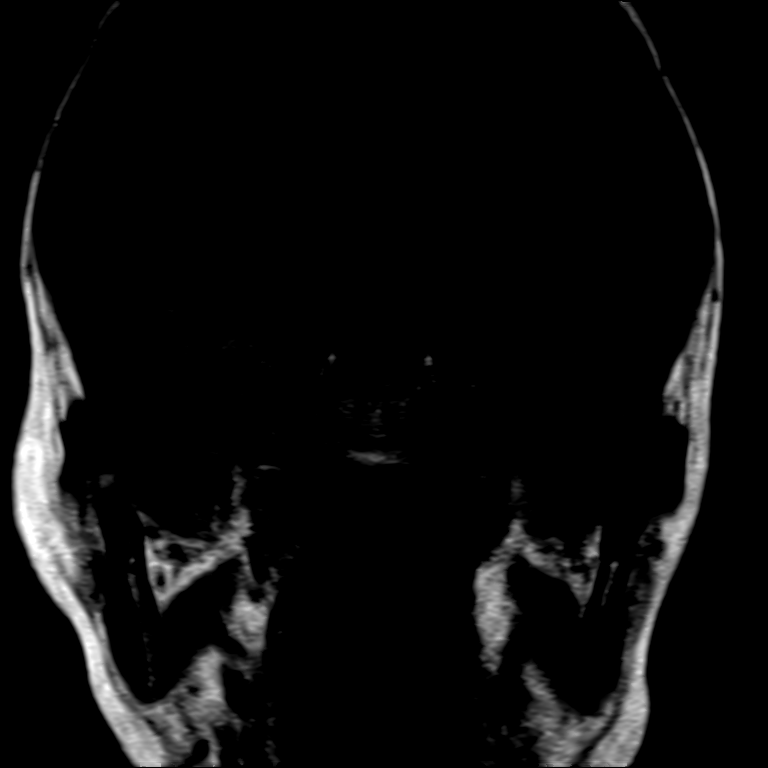

[Series 18: T1 · axial · non-contrast · 3.0mm · 0.21mm/px · 1 of 15 slices shown (3 of 4)]
[im 1/15]
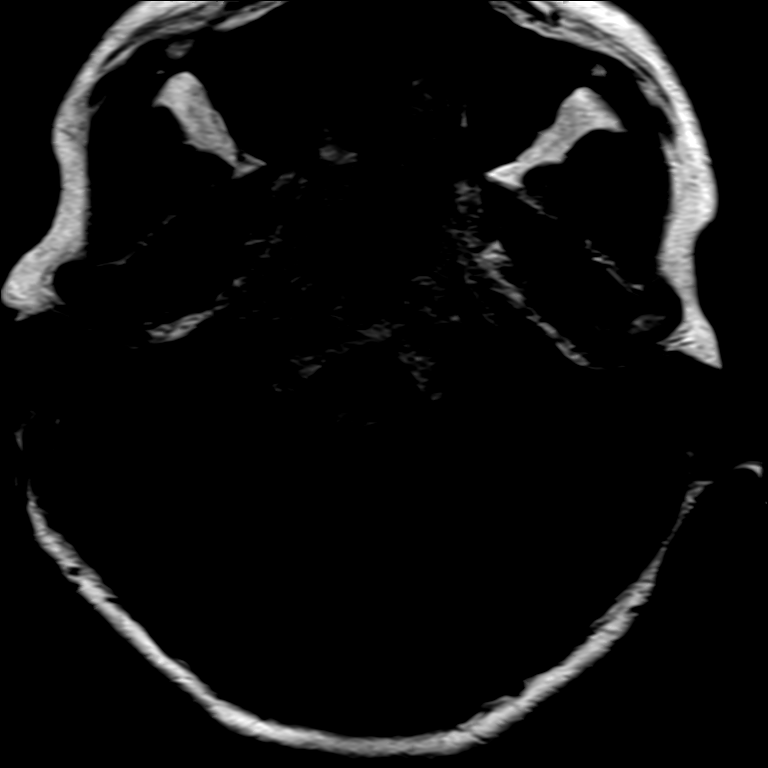

[Series 19: T1 · axial · 5.0mm · 0.90mm/px · z∈[-84,+72]mm · 2 of 27 slices shown (4 of 4)]
[im 1/27]
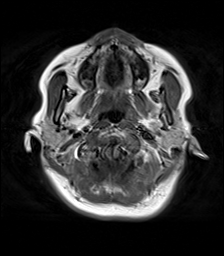
[im 27/27]
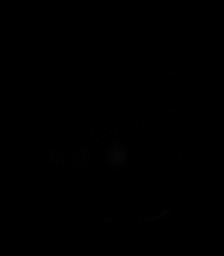

[Series 20: T1 post-contrast · axial · 3.0mm · 0.21mm/px · 1 of 15 slices shown (1 of 4)]
[im 1/15]
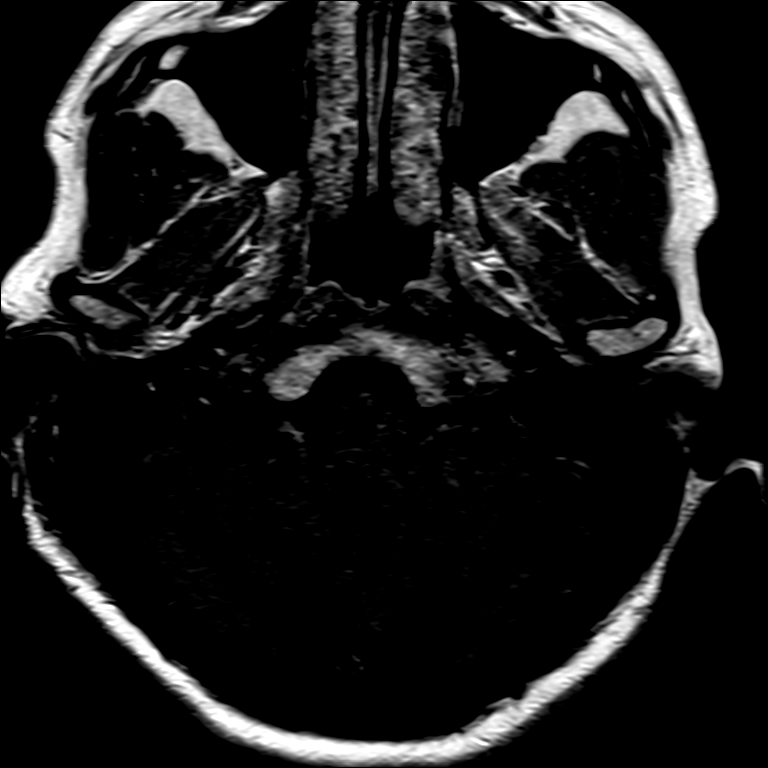

[Series 21: T1 post-contrast · coronal · 3.0mm · 0.21mm/px · 1 of 13 slices shown (2 of 4)]
[im 1/13]
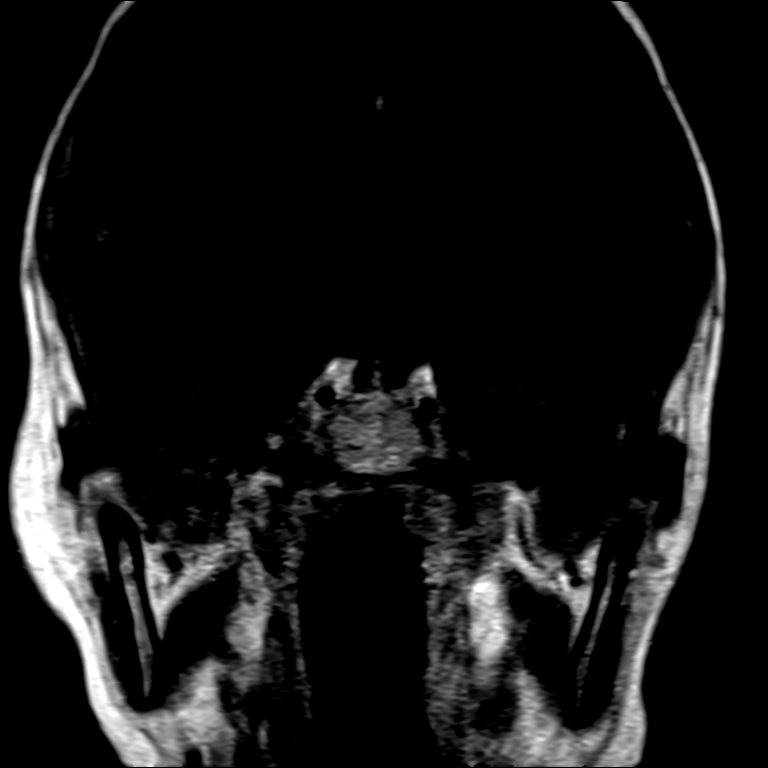

[Series 22: T1 post-contrast · axial · 1.0mm · 0.98mm/px · z∈[-88,+87]mm · 9 of 174 slices shown (3 of 4)]
[im 1/174]
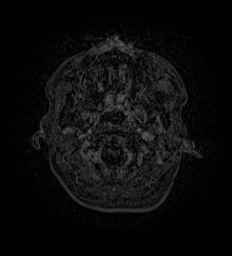
[im 32/174]
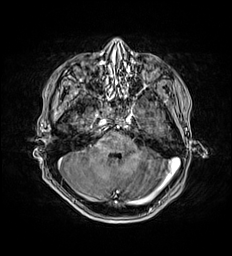
[im 48/174]
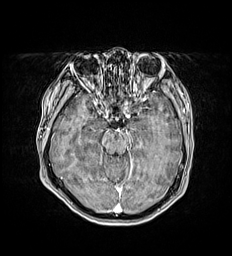
[im 79/174]
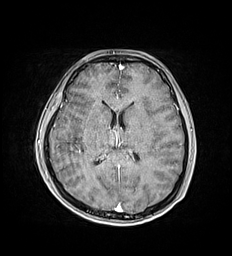
[im 95/174]
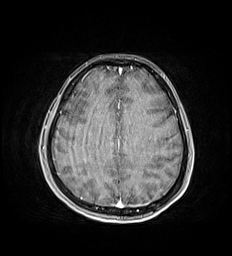
[im 126/174]
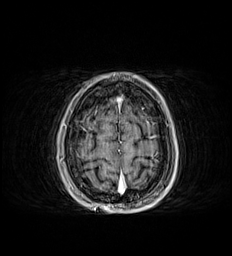
[im 142/174]
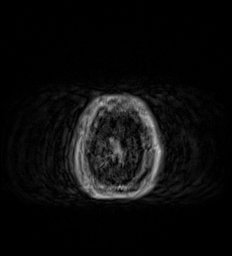
[im 158/174]
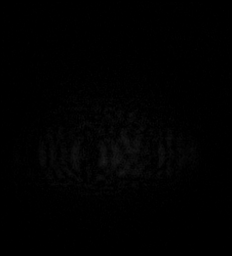
[im 174/174]
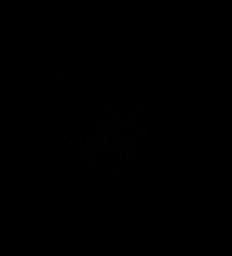

[Series 23: T1 post-contrast · coronal · 5.0mm · 0.90mm/px · 2 of 31 slices shown (4 of 4)]
[im 1/31]
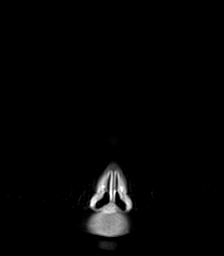
[im 31/31]
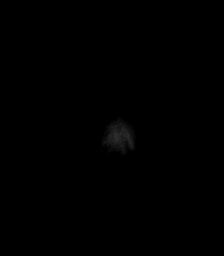

[28 of 48 positions shown; findings below may reference images not displayed]

FINDINGS: Brain:

The examination is intermittently motion degraded, limiting
evaluation. Most notably there is moderate motion degradation of the
axial T2/FLAIR sequence, moderate/severe motion degradation of the
coronal T1 weighted precontrast sequence through the IACs, moderate
motion degradation of the axial T1 weighted precontrast sequence
through the IACs, moderate motion degradation of the postcontrast T1
weighted sequence through the IACs, moderate/severe motion
degradation of the coronal postcontrast T1 weighted sequence through
the IAC's and moderate motion degradation of the axial and coronal
whole brain postcontrast T1 weighted imaging.

There is no evidence of acute infarct.

No evidence of intracranial mass. Specifically, no cerebellopontine
angle or internal auditory canal lesion is demonstrated.

No midline shift or extra-axial fluid collection.

No chronic intracranial blood products are identified.

No focal parenchymal signal abnormality is identified.

Cerebral volume is normal for age.  Partially empty sella turcica.

No abnormal intracranial enhancement is identified.

Vascular: Flow voids maintained within the proximal large arterial
vessels.

Skull and upper cervical spine: No focal marrow lesion.

Sinuses/Orbits: Visualized orbits demonstrate no acute abnormality.
Trace ethmoid sinus mucosal thickening. No significant mastoid
effusion.
IMPRESSION: Significantly motion degraded and limited examination as described.

No evidence of intracranial mass or acute intracranial abnormality.

## 2020-12-19 ENCOUNTER — Encounter: Payer: BLUE CROSS/BLUE SHIELD | Admitting: Family

## 2021-01-03 ENCOUNTER — Encounter: Payer: BLUE CROSS/BLUE SHIELD | Admitting: Family

## 2021-01-15 IMAGING — DX DG ABDOMEN 1V
1 series · 1 of 1 positions shown · non-contrast
Comparison: None.

CLINICAL DATA: Overdose.

EXAM:
ABDOMEN - 1 VIEW

[abdomen supine]
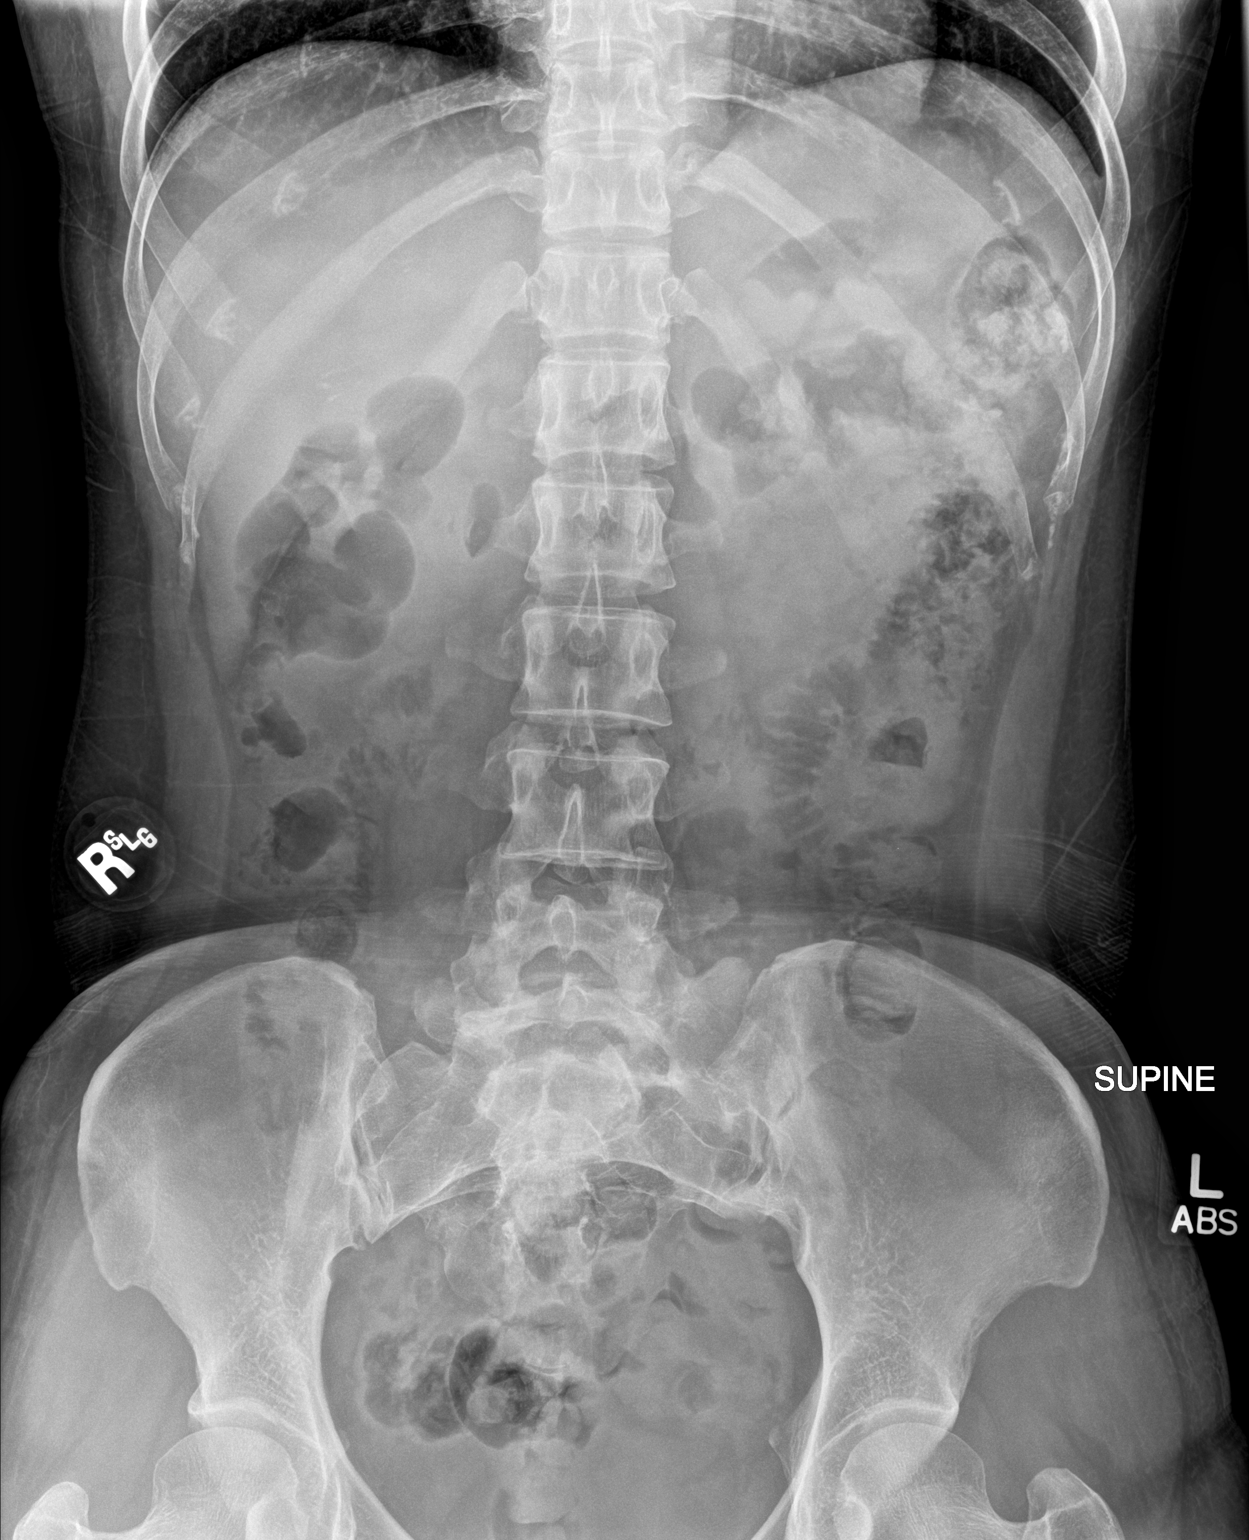

[1 of 1 positions shown; findings below may reference images not displayed]

FINDINGS: The bowel gas pattern is normal. No radio-opaque calculi or other
significant radiographic abnormality are seen.
IMPRESSION: Negative.

## 2021-03-31 ENCOUNTER — Telehealth: Payer: Self-pay | Admitting: Family

## 2021-03-31 NOTE — Telephone Encounter (Signed)
PT would like to get a refill of their cream that they use for itchiness they can't remember the name of it and would like a refill.

## 2021-03-31 NOTE — Telephone Encounter (Signed)
Looks as thought it was the hydrocortisone cream.rx ended in mar of 2021. Please advise.

## 2021-03-31 NOTE — Telephone Encounter (Signed)
I called patient she actually is seeing someone else as her PCP per insurance. I advised that until that PCP could see her for the vulvar itching that she is having that she see UC. Pt asked if you would refill, but I told her she needed to UC she has not been seen since 6/21.  hydrocortisone 2.5 % cream [[id number]]  DISCONTINUED  Dose: -- Route: Topical Frequency: 2 times daily  Dispense Quantity: 30 g Refills: 0        Sig: Apply topically 2 (two) times daily. To outside skin

## 2021-03-31 NOTE — Telephone Encounter (Signed)
Call pt  Not entirely sure what sort of cream she needs  Would advise OTC benadryl cream for itching  Please sch f/u appt as it has been one year

## 2021-04-05 NOTE — Telephone Encounter (Signed)
Agree with advise to be seen in UC for vaginal itching

## 2022-07-25 LAB — HSV DNA BY PCR (REFERENCE LAB)
HSV 1 DNA: NEGATIVE
HSV 2 DNA: NEGATIVE
